# Patient Record
Sex: Female | Born: 1965 | Race: White | Hispanic: No | Marital: Married | Smoking: Never smoker
Health system: Southern US, Community
[De-identification: ages and names within clinical notes are randomized; demographics above are authoritative.]

## PROBLEM LIST (undated history)

## (undated) DIAGNOSIS — E039 Hypothyroidism, unspecified: Secondary | ICD-10-CM

## (undated) DIAGNOSIS — F419 Anxiety disorder, unspecified: Secondary | ICD-10-CM

## (undated) DIAGNOSIS — R002 Palpitations: Secondary | ICD-10-CM

## (undated) DIAGNOSIS — R5383 Other fatigue: Secondary | ICD-10-CM

## (undated) DIAGNOSIS — N951 Menopausal and female climacteric states: Secondary | ICD-10-CM

## (undated) DIAGNOSIS — R0600 Dyspnea, unspecified: Secondary | ICD-10-CM

## (undated) HISTORY — DX: Dyspnea, unspecified: R06.00

## (undated) HISTORY — DX: Palpitations: R00.2

## (undated) HISTORY — DX: Anxiety disorder, unspecified: F41.9

## (undated) HISTORY — DX: Hypothyroidism, unspecified: E03.9

## (undated) HISTORY — DX: Other fatigue: R53.83

## (undated) HISTORY — DX: Menopausal and female climacteric states: N95.1

## (undated) HISTORY — PX: HYSTEROSCOPY: SHX211

---

## 1998-06-05 ENCOUNTER — Ambulatory Visit (HOSPITAL_COMMUNITY): Admission: RE | Admit: 1998-06-05 | Discharge: 1998-06-05 | Payer: Self-pay | Admitting: Internal Medicine

## 1998-06-06 ENCOUNTER — Encounter: Payer: Self-pay | Admitting: Internal Medicine

## 2000-05-14 ENCOUNTER — Encounter: Payer: Self-pay | Admitting: Family Medicine

## 2000-05-14 ENCOUNTER — Ambulatory Visit (HOSPITAL_COMMUNITY): Admission: RE | Admit: 2000-05-14 | Discharge: 2000-05-14 | Payer: Self-pay | Admitting: Family Medicine

## 2000-05-21 ENCOUNTER — Ambulatory Visit: Admission: RE | Admit: 2000-05-21 | Discharge: 2000-05-21 | Payer: Self-pay | Admitting: Family Medicine

## 2000-11-14 ENCOUNTER — Other Ambulatory Visit: Admission: RE | Admit: 2000-11-14 | Discharge: 2000-11-14 | Payer: Self-pay | Admitting: Family Medicine

## 2001-06-30 ENCOUNTER — Encounter: Admission: RE | Admit: 2001-06-30 | Discharge: 2001-06-30 | Payer: Self-pay | Admitting: Family Medicine

## 2001-06-30 ENCOUNTER — Encounter: Payer: Self-pay | Admitting: Family Medicine

## 2002-01-01 ENCOUNTER — Encounter: Payer: Self-pay | Admitting: Family Medicine

## 2002-01-01 ENCOUNTER — Encounter: Admission: RE | Admit: 2002-01-01 | Discharge: 2002-01-01 | Payer: Self-pay | Admitting: Family Medicine

## 2002-02-18 ENCOUNTER — Other Ambulatory Visit: Admission: RE | Admit: 2002-02-18 | Discharge: 2002-02-18 | Payer: Self-pay | Admitting: Family Medicine

## 2002-11-23 ENCOUNTER — Encounter: Admission: RE | Admit: 2002-11-23 | Discharge: 2002-11-23 | Payer: Self-pay

## 2002-11-25 ENCOUNTER — Encounter: Admission: RE | Admit: 2002-11-25 | Discharge: 2002-11-25 | Payer: Self-pay

## 2003-02-22 ENCOUNTER — Encounter: Payer: Self-pay | Admitting: Family Medicine

## 2003-02-22 ENCOUNTER — Encounter: Admission: RE | Admit: 2003-02-22 | Discharge: 2003-02-22 | Payer: Self-pay | Admitting: Family Medicine

## 2003-04-08 ENCOUNTER — Other Ambulatory Visit: Admission: RE | Admit: 2003-04-08 | Discharge: 2003-04-08 | Payer: Self-pay | Admitting: Family Medicine

## 2003-04-20 ENCOUNTER — Encounter: Admission: RE | Admit: 2003-04-20 | Discharge: 2003-04-20 | Payer: Self-pay | Admitting: Family Medicine

## 2003-08-24 ENCOUNTER — Encounter: Admission: RE | Admit: 2003-08-24 | Discharge: 2003-08-24 | Payer: Self-pay | Admitting: Family Medicine

## 2004-06-06 ENCOUNTER — Other Ambulatory Visit: Admission: RE | Admit: 2004-06-06 | Discharge: 2004-06-06 | Payer: Self-pay | Admitting: Family Medicine

## 2004-06-14 ENCOUNTER — Encounter: Admission: RE | Admit: 2004-06-14 | Discharge: 2004-06-14 | Payer: Self-pay | Admitting: Family Medicine

## 2004-11-12 IMAGING — US US PELVIS COMPLETE MODIFY
1 series · 13 of 25 positions shown · non-contrast
Comparison: none

CLINICAL DATA: Left lower quadrant pain.  Follow-up ovarian cyst (based on CT 11/25/02).

[Series 1: unknown · 0.33mm/px · 13 of 42 slices shown]
[im 1/42]
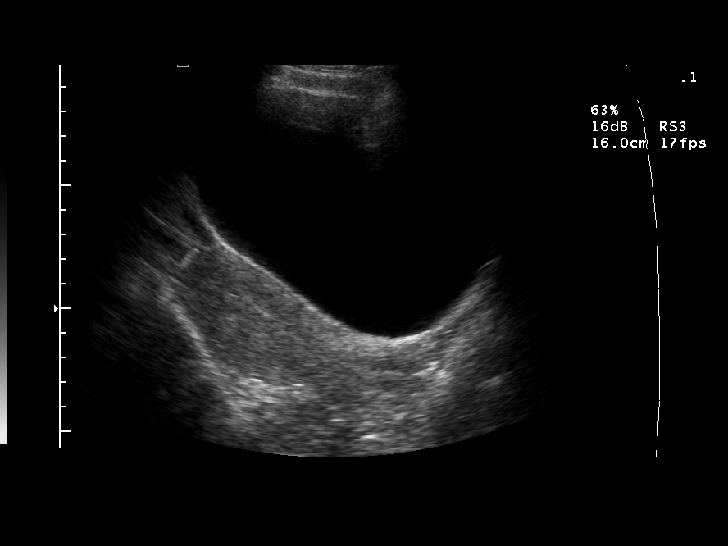
[im 4/42]
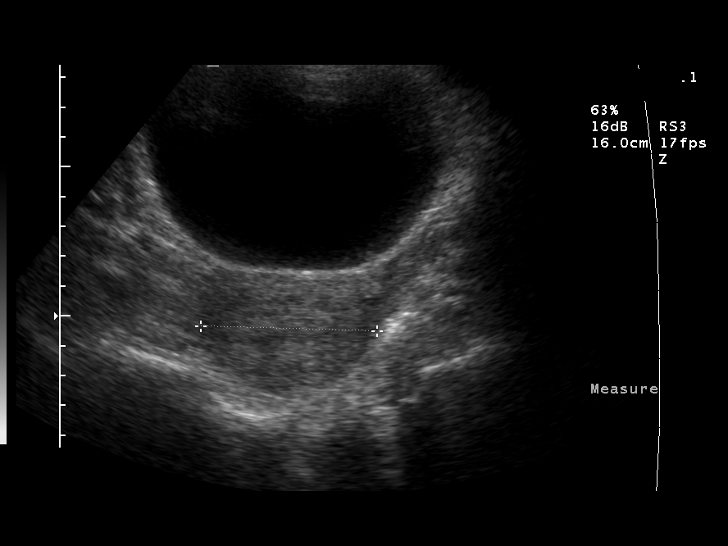
[im 7/42]
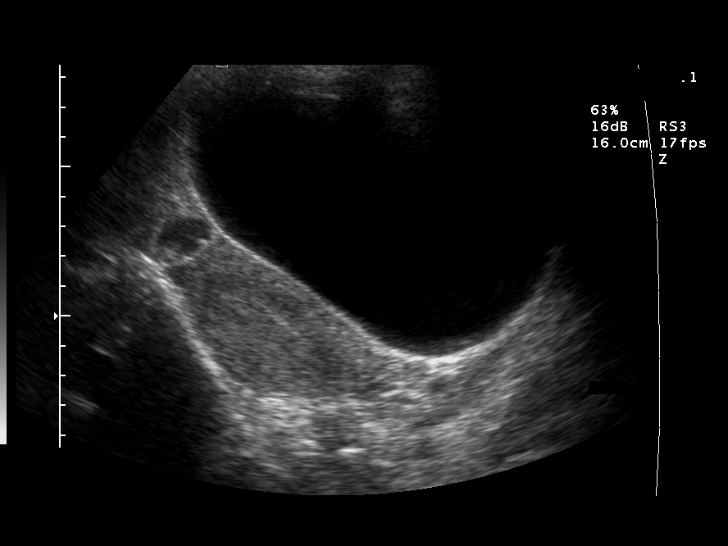
[im 11/42]
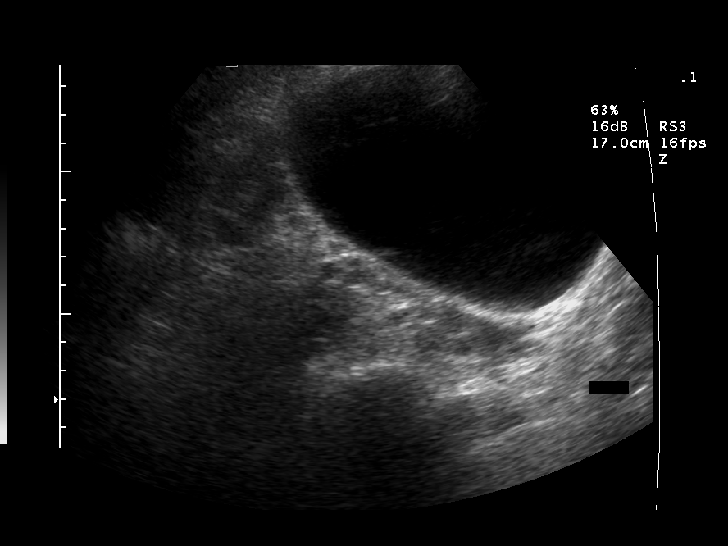
[im 14/42]
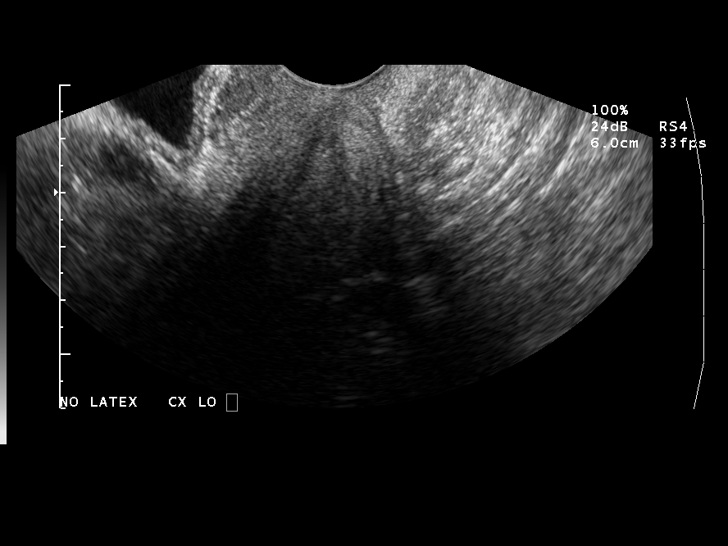
[im 18/42]
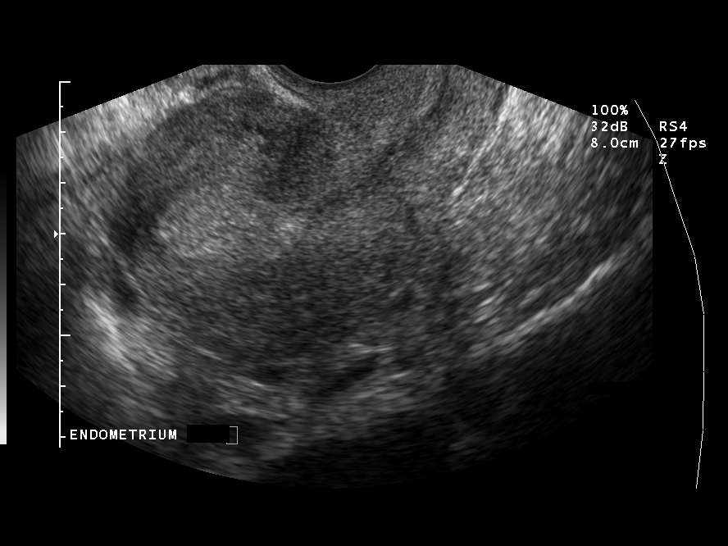
[im 21/42]
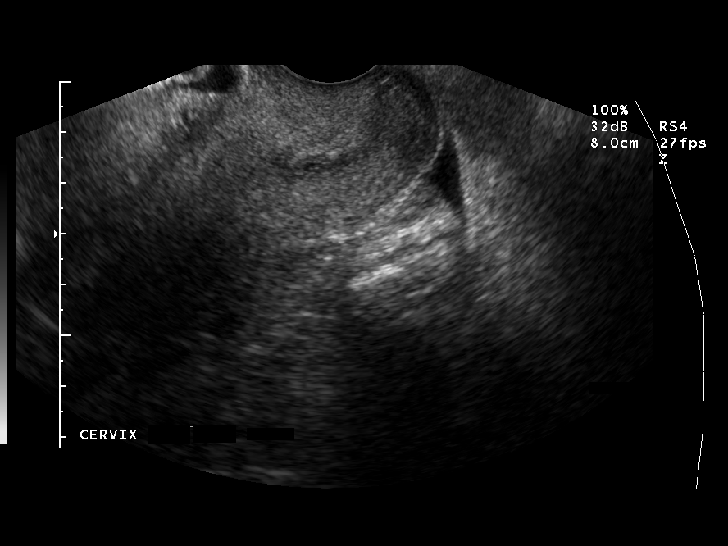
[im 24/42]
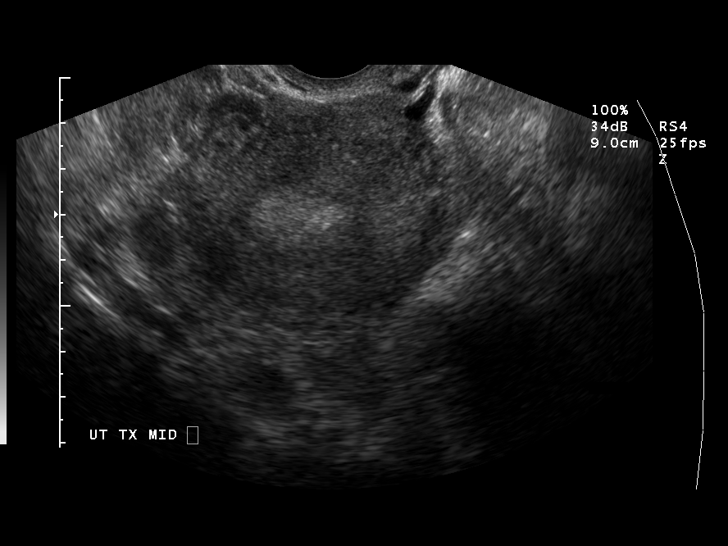
[im 28/42]
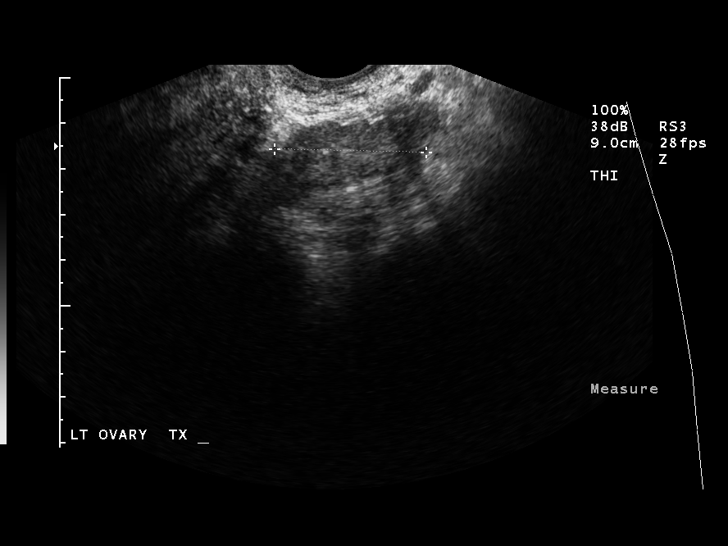
[im 31/42]
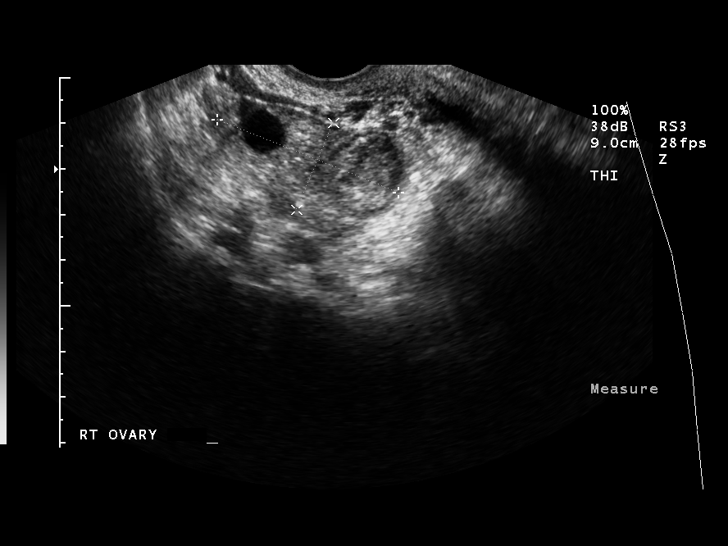
[im 35/42]
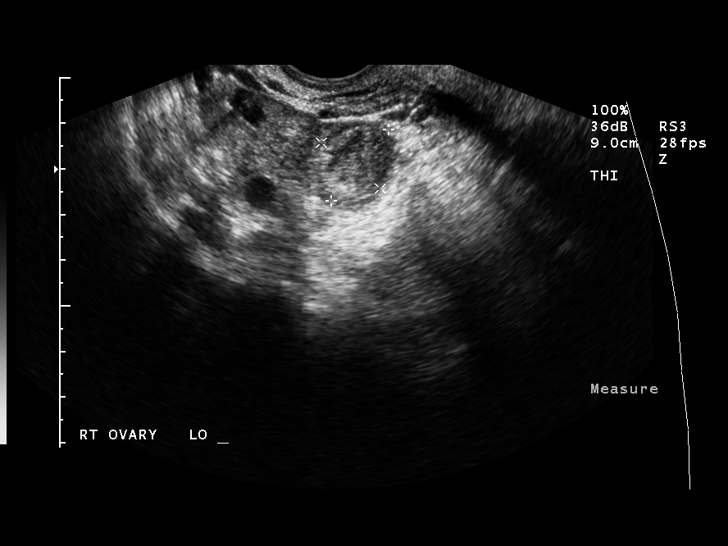
[im 38/42]
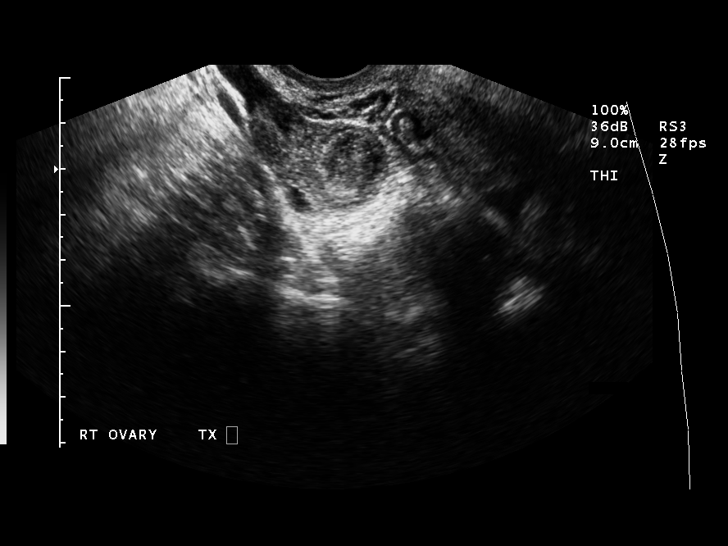
[im 42/42]
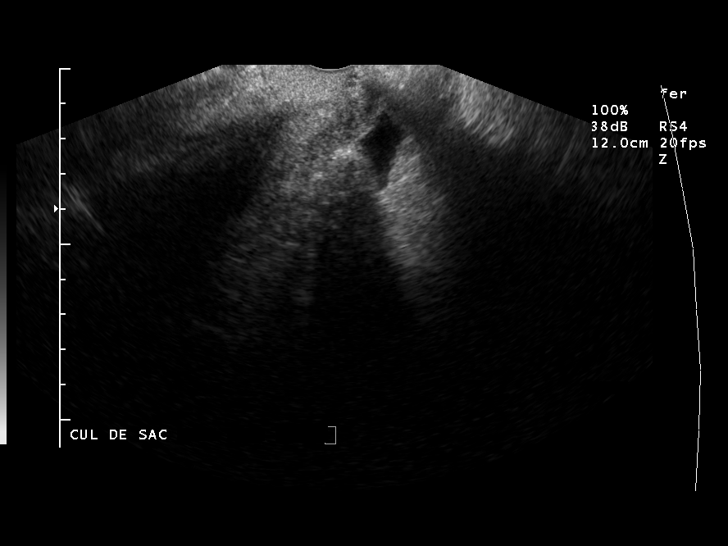

[13 of 25 positions shown; findings below may reference images not displayed]

PELVIS ULTRASOUND ? TRANSABDOMINAL AND TRANSVAGINAL NON-OB
 Overall uterine size and contour are within normal limits.  The endometrium is homogeneously thickened but the patient is midcycle so this is probably physiological.  There are no lesions of the myometrium.

 The left ovary is currently normal, measuring 3.1 x 1.6 x 3.3 cm.  A left ovarian cyst noted on the prior CT has resolved.

 The right ovary is 4.3 x 2.1 x 3.7 cm.  Within it, there is a complex lesion measuring 2.0 x 1.6 x 1.5 cm.  This has the appearance of a hemorrhagic corpus luteum cyst.  The right ovary was normal on the prior CT so this is a new finding since then.  Careful clinical correlation is warranted.  One might consider a follow-up ultrasound in four to six weeks.

 There is a small amount of fluid in the cul-de-sac that I would not regard as pathological.

 IMPRESSION

 1.  Left ovarian cyst noted on CT in November 2002 has resolved.  The left ovary is currently normal.
 2.  There is now, however, a 2 cm complex cystic lesion of the right ovary.  This is most likely a hemorrhagic corpus luteum cyst.  See above.
 3.  No other findings of significance.

## 2008-03-09 ENCOUNTER — Encounter: Admission: RE | Admit: 2008-03-09 | Discharge: 2008-03-09 | Payer: Self-pay | Admitting: Family Medicine

## 2008-09-28 ENCOUNTER — Encounter: Admission: RE | Admit: 2008-09-28 | Discharge: 2008-09-28 | Payer: Self-pay | Admitting: Endocrinology

## 2010-01-12 ENCOUNTER — Encounter: Admission: RE | Admit: 2010-01-12 | Discharge: 2010-01-12 | Payer: Self-pay | Admitting: Endocrinology

## 2010-04-23 IMAGING — US US SOFT TISSUE HEAD/NECK
1 series · 14 of 25 positions shown · non-contrast
Comparison: 03/09/2008

CLINICAL DATA: Follow up goiter

THYROID ULTRASOUND
TECHNIQUE: Ultrasound examination of the thyroid gland and
adjacent soft tissues was performed.

[Series 1: us soft tissue head/neck · 0.08mm/px · 14 of 41 slices shown]
[im 1/41]
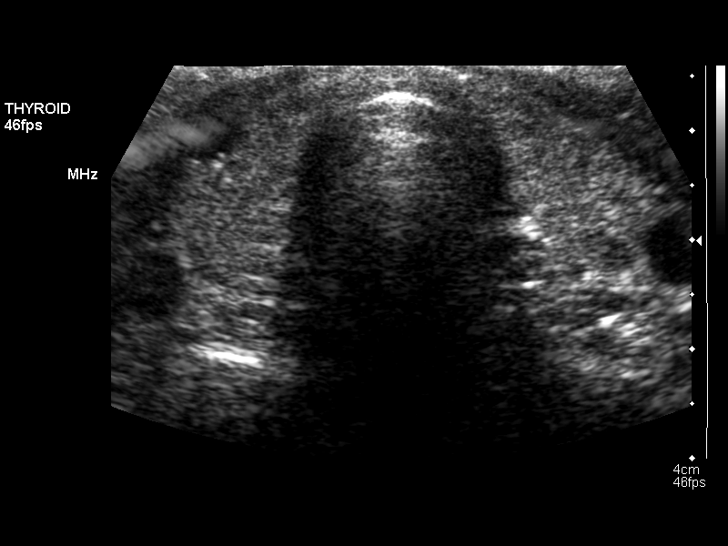
[im 4/41]
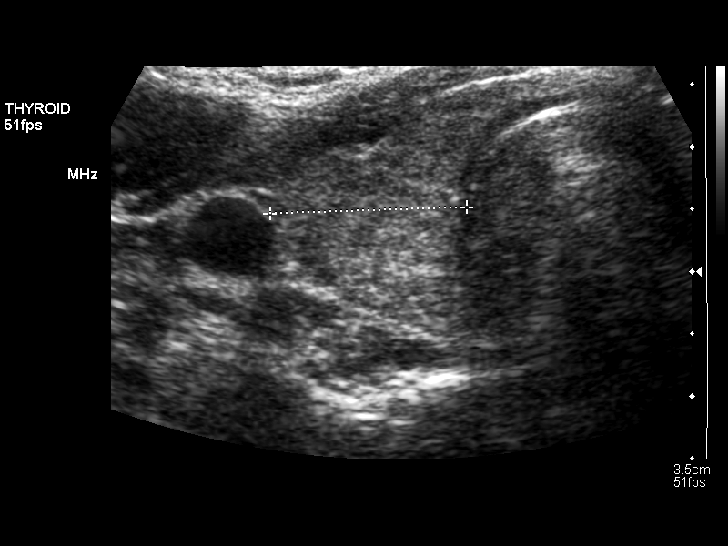
[im 7/41]
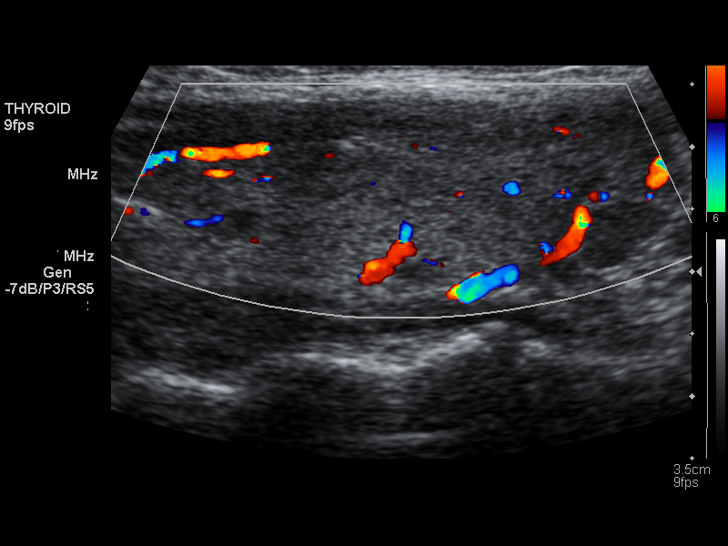
[im 11/41]
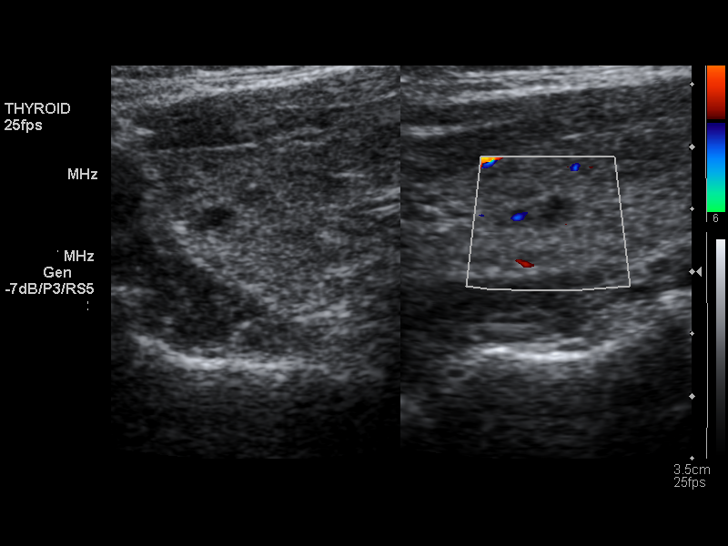
[im 14/41]
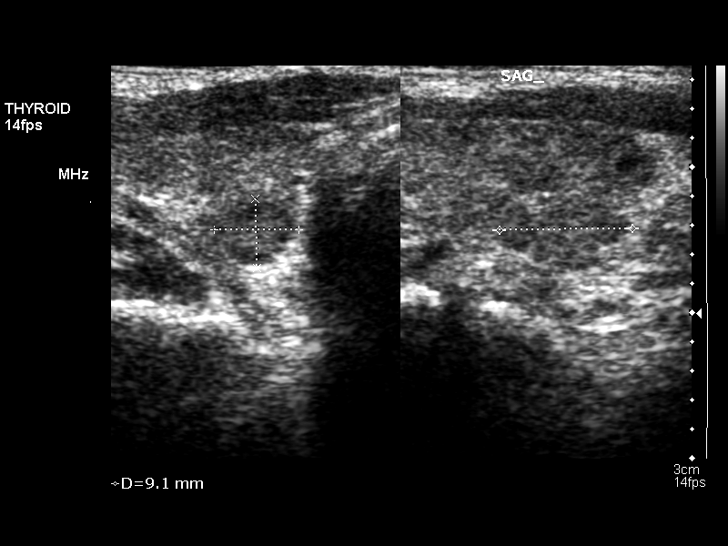
[im 16/41]
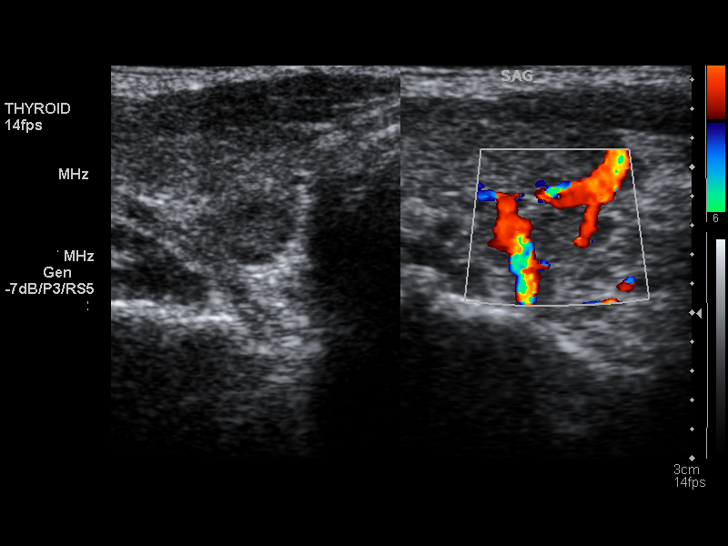
[im 19/41]
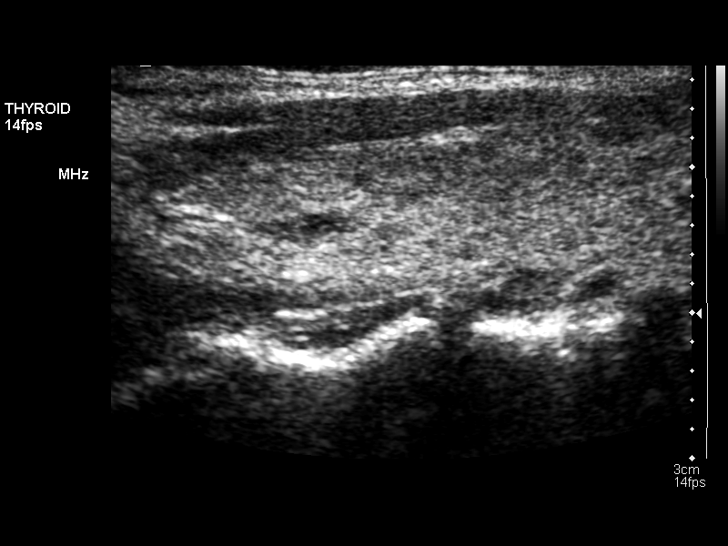
[im 22/41]
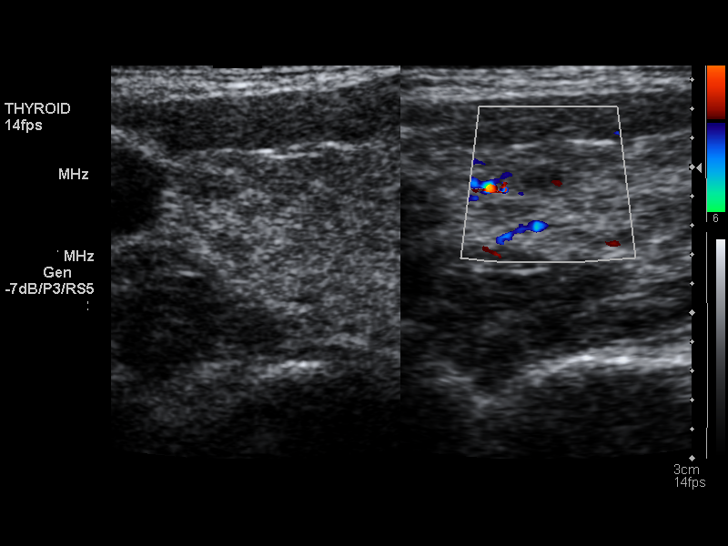
[im 26/41]
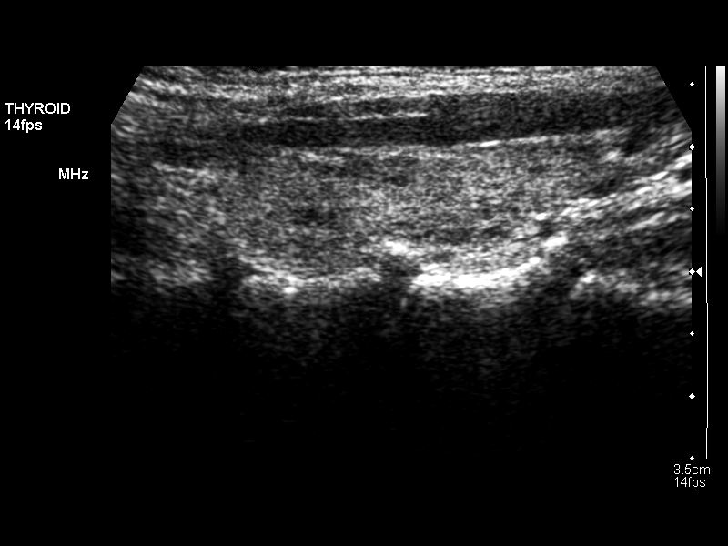
[im 27/41]
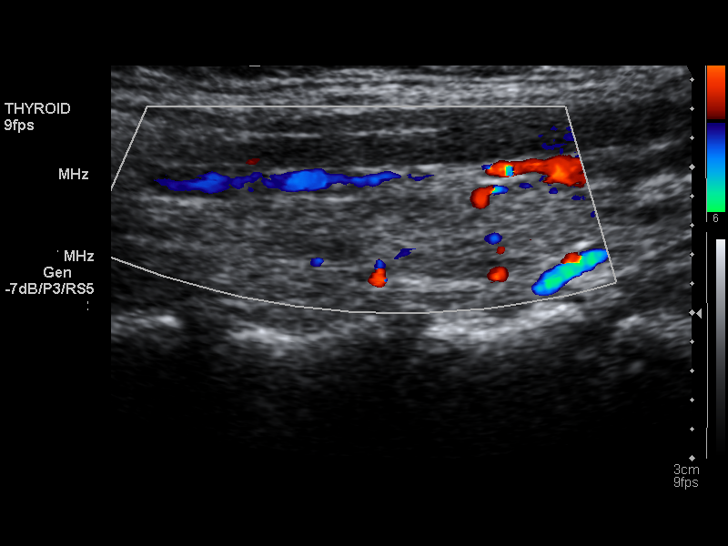
[im 31/41]
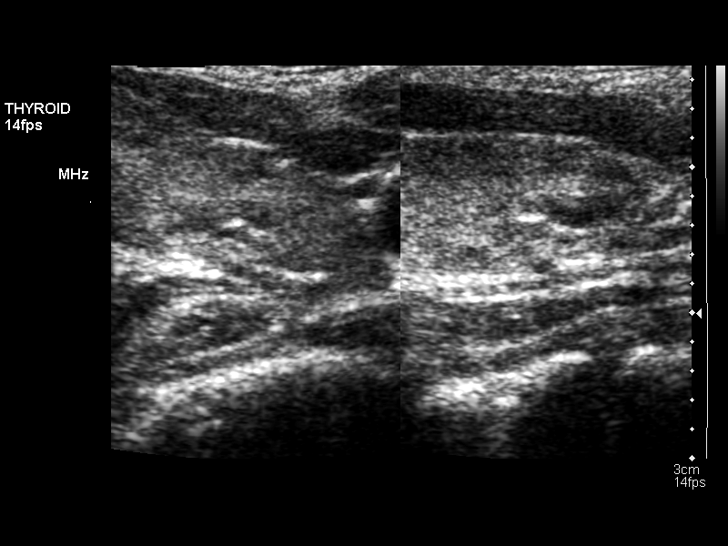
[im 34/41]
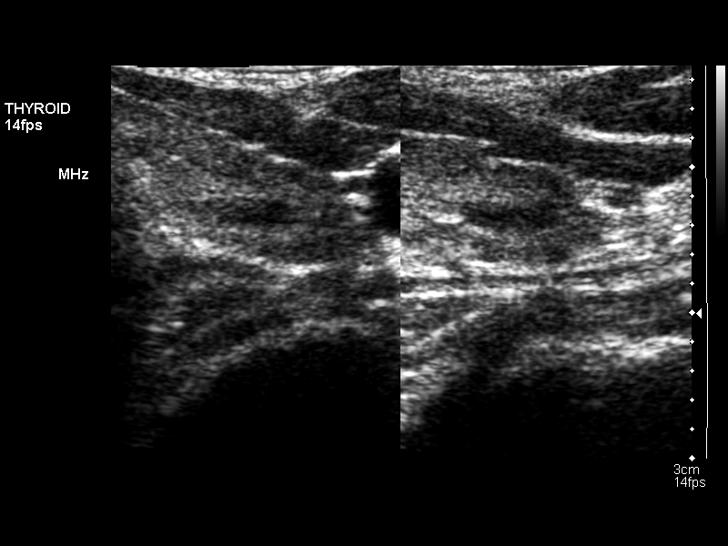
[im 37/41]
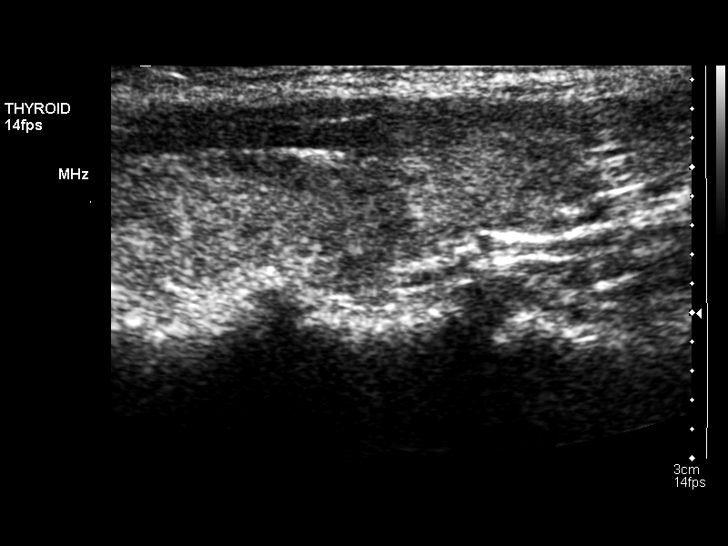
[im 41/41]
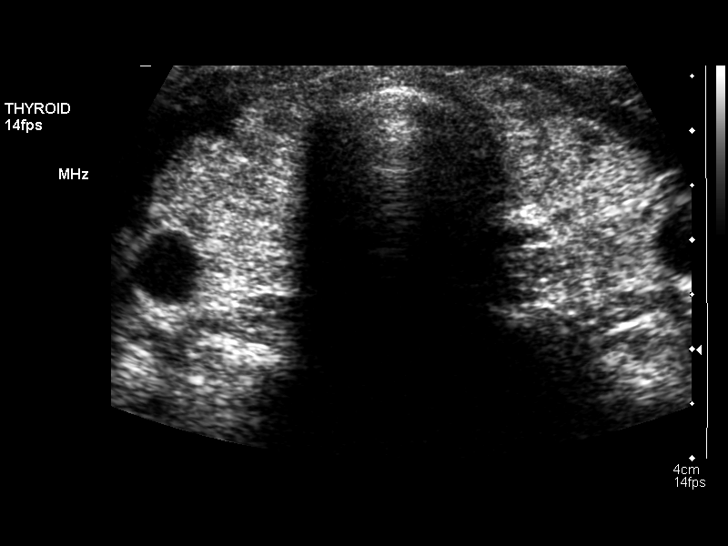

[14 of 25 positions shown; findings below may reference images not displayed]

FINDINGS: Stable appearance of 3 nodules in the periphery of the
right thyroid lobe and one nodule in the mid aspect left thyroid
lobe and one in the left lower pole.  All of the nodules measure
less than a centimeter in diameter. There is diffuse inhomogeneity
of thyroid echotexture.
IMPRESSION: Stable appearance of the thyroid gland with multinodular goiter
appreciated.  The nodules are stable in size and character.  All of
the nodules measure less than a centimeter in diameter.

## 2010-07-13 ENCOUNTER — Other Ambulatory Visit (HOSPITAL_COMMUNITY)
Admission: RE | Admit: 2010-07-13 | Discharge: 2010-07-13 | Disposition: A | Discharge status: Home / Self Care | Payer: BC Managed Care – PPO | Source: Ambulatory Visit | Attending: Endocrinology | Admitting: Endocrinology

## 2010-07-13 DIAGNOSIS — Z01419 Encounter for gynecological examination (general) (routine) without abnormal findings: Secondary | ICD-10-CM | POA: Insufficient documentation

## 2010-10-23 ENCOUNTER — Other Ambulatory Visit: Payer: Self-pay | Admitting: Endocrinology

## 2010-10-23 DIAGNOSIS — E049 Nontoxic goiter, unspecified: Secondary | ICD-10-CM

## 2011-01-14 ENCOUNTER — Ambulatory Visit
Admission: RE | Admit: 2011-01-14 | Discharge: 2011-01-14 | Disposition: A | Discharge status: Home / Self Care | Payer: BC Managed Care – PPO | Source: Ambulatory Visit | Attending: Endocrinology | Admitting: Endocrinology

## 2011-01-14 DIAGNOSIS — E049 Nontoxic goiter, unspecified: Secondary | ICD-10-CM

## 2012-07-23 ENCOUNTER — Other Ambulatory Visit: Payer: Self-pay | Admitting: Endocrinology

## 2012-07-23 DIAGNOSIS — E049 Nontoxic goiter, unspecified: Secondary | ICD-10-CM

## 2012-08-08 IMAGING — US US SOFT TISSUE HEAD/NECK
1 series · 14 of 25 positions shown · non-contrast
Comparison: Thyroid ultrasound 01/12/2010.

CLINICAL DATA: Thyroid goiter.

THYROID ULTRASOUND
TECHNIQUE: Ultrasound examination of the thyroid gland and adjacent
soft tissues was performed.

[Series 1: us soft tissue head/neck · 0.08mm/px · 14 of 48 slices shown]
[im 1/48]
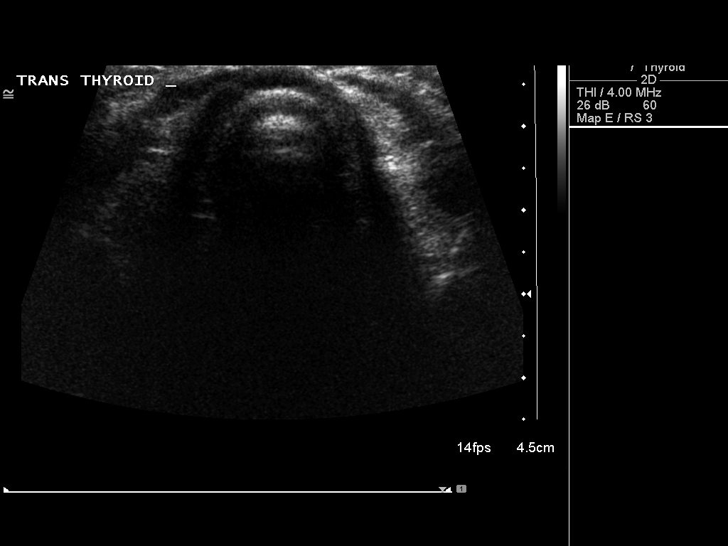
[im 4/48]
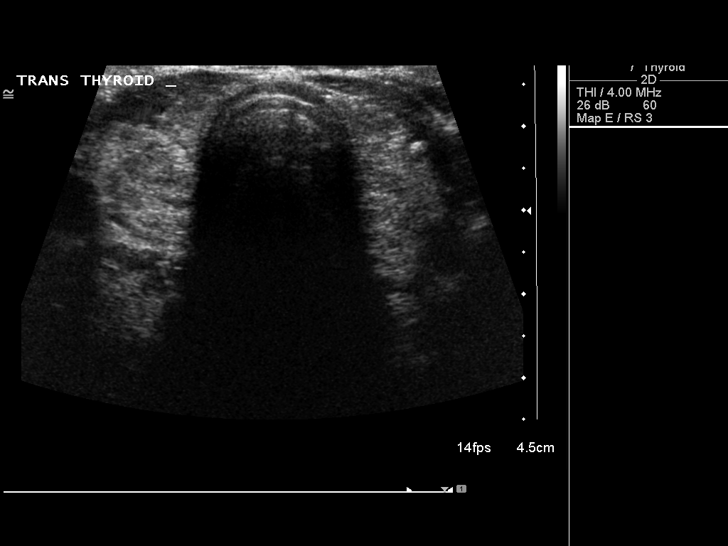
[im 8/48]
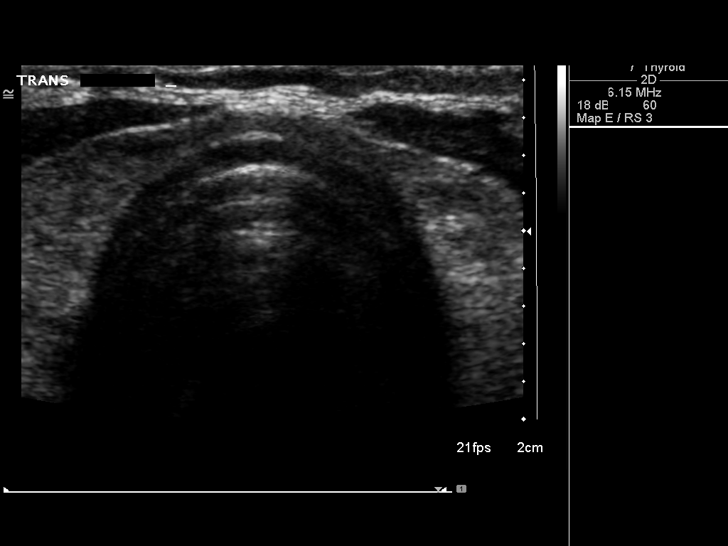
[im 12/48]
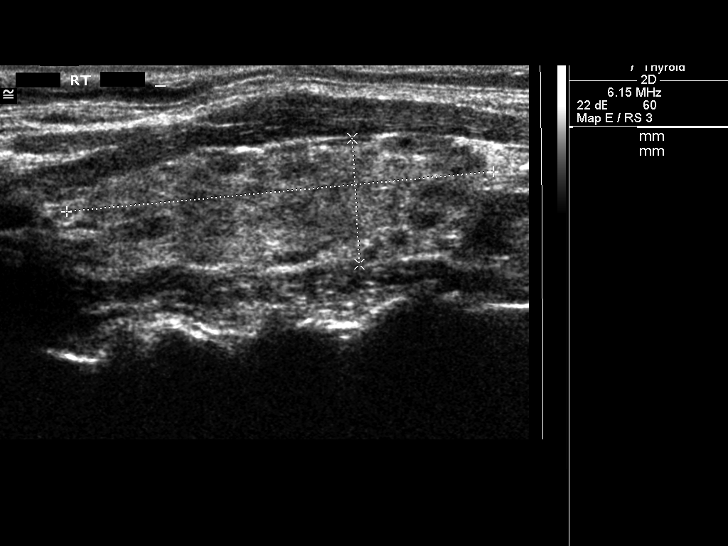
[im 16/48]
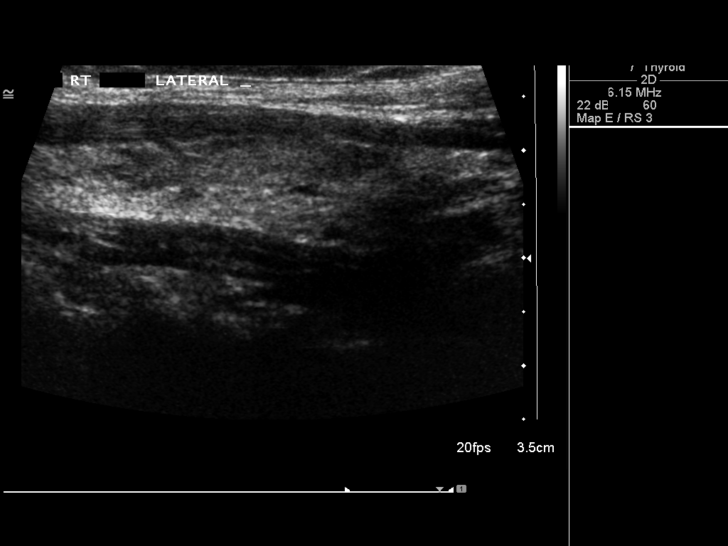
[im 18/48]
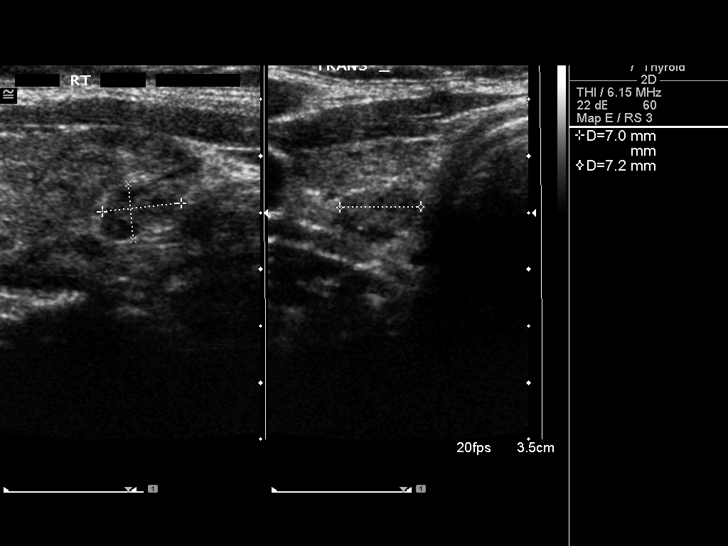
[im 22/48]
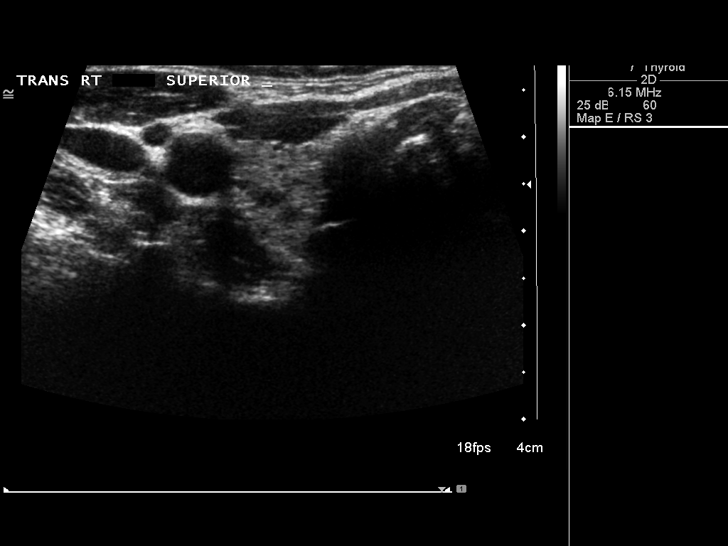
[im 26/48]
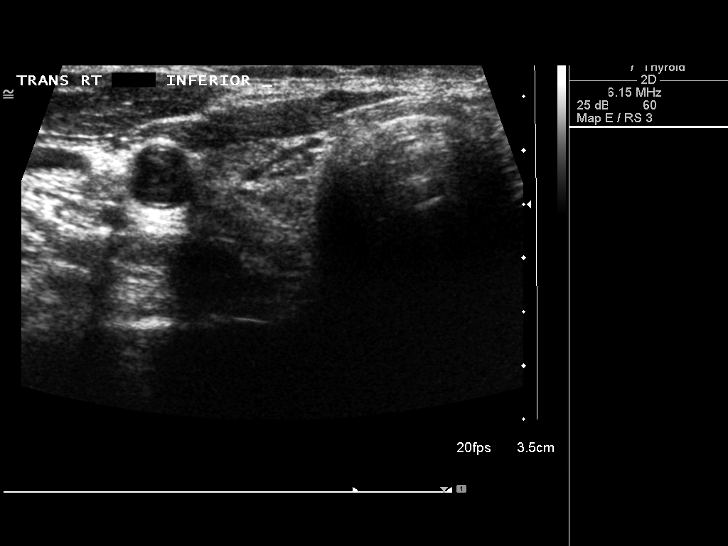
[im 30/48]
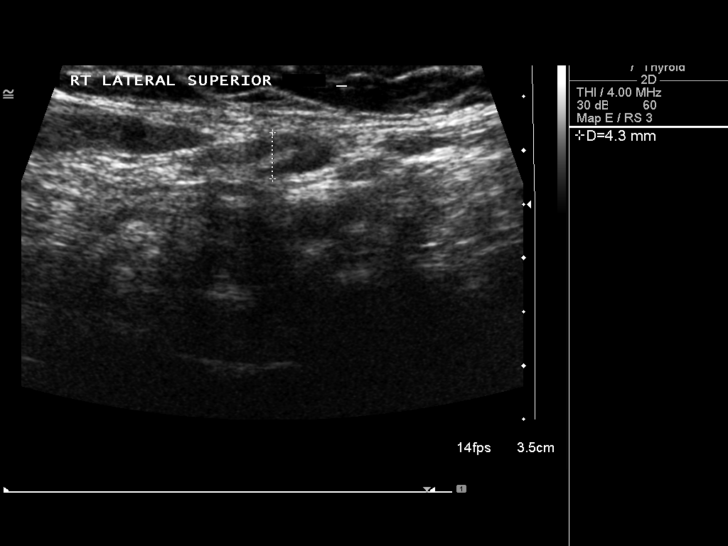
[im 32/48]
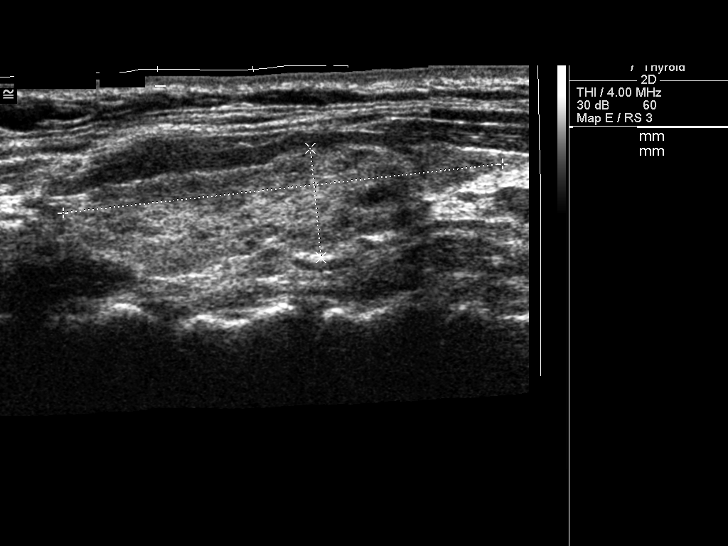
[im 36/48]
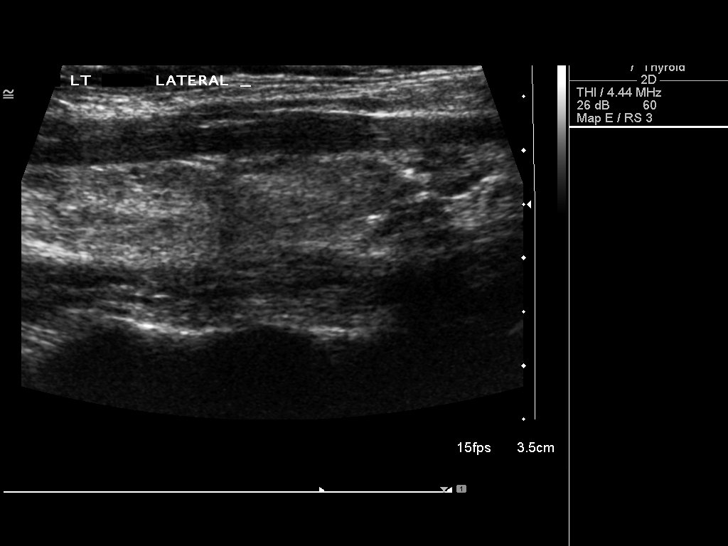
[im 40/48]
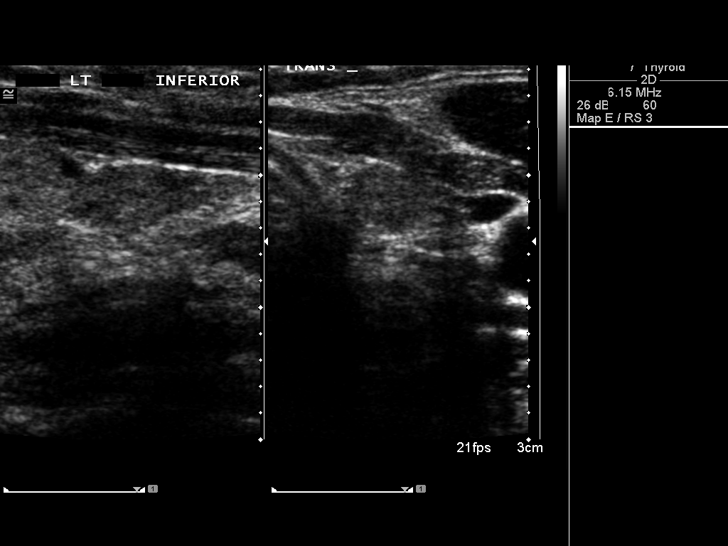
[im 44/48]
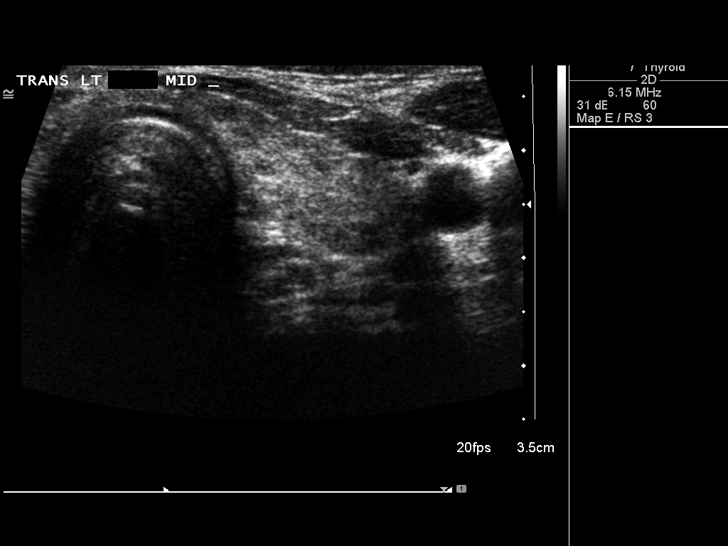
[im 48/48]
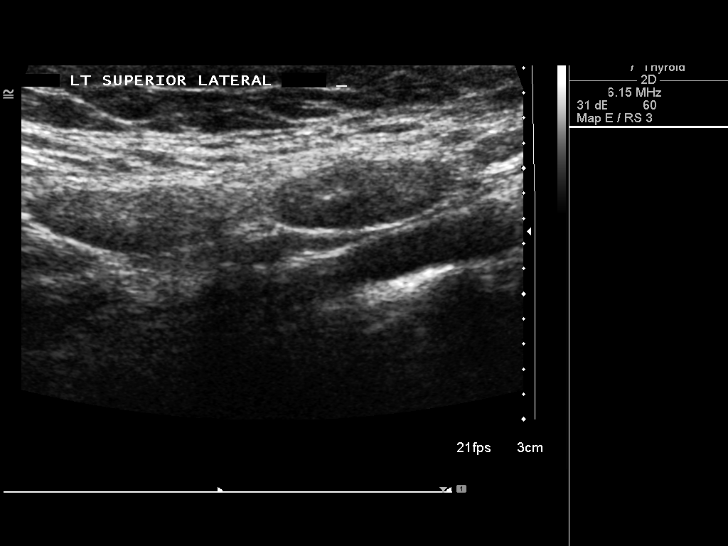

[14 of 25 positions shown; findings below may reference images not displayed]

FINDINGS: Right thyroid lobe:  Measures 4.3 x 1.8 x 1.6 cm.
Left thyroid lobe:  Measures 4.6 x 1.2 x 1.7 cm.
Isthmus:  Measures 0.1 cm.

Stable heterogeneous echogenicity.

Focal nodules:  There are stable small sub centimeter lower pole
nodules.

Lymphadenopathy:  Small scattered stable lymph nodes.
IMPRESSION: Stable thyroid ultrasound examination.  No change in small sub
centimeter lower pole nodules.

## 2012-12-28 ENCOUNTER — Other Ambulatory Visit: Payer: BC Managed Care – PPO

## 2012-12-31 ENCOUNTER — Ambulatory Visit
Admission: RE | Admit: 2012-12-31 | Discharge: 2012-12-31 | Disposition: A | Discharge status: Home / Self Care | Payer: BC Managed Care – PPO | Source: Ambulatory Visit | Attending: Endocrinology | Admitting: Endocrinology

## 2012-12-31 DIAGNOSIS — E049 Nontoxic goiter, unspecified: Secondary | ICD-10-CM

## 2014-01-25 ENCOUNTER — Other Ambulatory Visit: Payer: Self-pay | Admitting: Endocrinology

## 2014-01-25 DIAGNOSIS — E049 Nontoxic goiter, unspecified: Secondary | ICD-10-CM

## 2014-01-28 ENCOUNTER — Ambulatory Visit
Admission: RE | Admit: 2014-01-28 | Discharge: 2014-01-28 | Disposition: A | Discharge status: Home / Self Care | Payer: BC Managed Care – PPO | Source: Ambulatory Visit | Attending: Endocrinology | Admitting: Endocrinology

## 2014-01-28 DIAGNOSIS — E049 Nontoxic goiter, unspecified: Secondary | ICD-10-CM

## 2015-03-07 ENCOUNTER — Other Ambulatory Visit: Payer: Self-pay | Admitting: Endocrinology

## 2015-03-07 DIAGNOSIS — E049 Nontoxic goiter, unspecified: Secondary | ICD-10-CM

## 2015-05-25 ENCOUNTER — Ambulatory Visit (HOSPITAL_BASED_OUTPATIENT_CLINIC_OR_DEPARTMENT_OTHER): Admit: 2015-05-25 | Payer: Self-pay | Admitting: Obstetrics and Gynecology

## 2015-05-25 ENCOUNTER — Encounter (HOSPITAL_BASED_OUTPATIENT_CLINIC_OR_DEPARTMENT_OTHER): Payer: Self-pay

## 2015-05-25 SURGERY — DILATATION & CURETTAGE/HYSTEROSCOPY WITH MYOSURE
Anesthesia: Choice

## 2016-02-02 ENCOUNTER — Ambulatory Visit
Admission: RE | Admit: 2016-02-02 | Discharge: 2016-02-02 | Disposition: A | Discharge status: Home / Self Care | Payer: BLUE CROSS/BLUE SHIELD | Source: Ambulatory Visit | Attending: Endocrinology | Admitting: Endocrinology

## 2016-02-02 DIAGNOSIS — E049 Nontoxic goiter, unspecified: Secondary | ICD-10-CM

## 2016-03-01 ENCOUNTER — Other Ambulatory Visit: Payer: Self-pay

## 2016-03-04 ENCOUNTER — Other Ambulatory Visit: Payer: Self-pay

## 2018-02-12 ENCOUNTER — Other Ambulatory Visit: Payer: Self-pay | Admitting: Endocrinology

## 2018-02-12 DIAGNOSIS — E049 Nontoxic goiter, unspecified: Secondary | ICD-10-CM

## 2018-02-19 ENCOUNTER — Other Ambulatory Visit: Payer: BLUE CROSS/BLUE SHIELD

## 2018-03-05 ENCOUNTER — Ambulatory Visit
Admission: RE | Admit: 2018-03-05 | Discharge: 2018-03-05 | Disposition: A | Discharge status: Home / Self Care | Payer: PRIVATE HEALTH INSURANCE | Source: Ambulatory Visit | Attending: Endocrinology | Admitting: Endocrinology

## 2018-03-05 DIAGNOSIS — E049 Nontoxic goiter, unspecified: Secondary | ICD-10-CM

## 2018-04-02 ENCOUNTER — Ambulatory Visit: Payer: Self-pay | Admitting: Cardiovascular Disease

## 2018-07-24 ENCOUNTER — Ambulatory Visit: Payer: Self-pay | Admitting: Cardiovascular Disease

## 2019-05-27 ENCOUNTER — Ambulatory Visit: Payer: PRIVATE HEALTH INSURANCE | Attending: Internal Medicine

## 2019-05-27 DIAGNOSIS — Z20822 Contact with and (suspected) exposure to covid-19: Secondary | ICD-10-CM

## 2019-05-29 LAB — NOVEL CORONAVIRUS, NAA: SARS-CoV-2, NAA: NOT DETECTED

## 2019-07-14 IMAGING — US US THYROID
1 series · 14 of 25 positions shown · non-contrast
Comparison: 02/02/2016, 12/31/2012

CLINICAL DATA: Goiter.

EXAM:
THYROID ULTRASOUND
TECHNIQUE: Ultrasound examination of the thyroid gland and adjacent soft
tissues was performed.

[Series 1: us thyroid · 0.05mm/px · 14 of 57 slices shown]
[im 1/57]
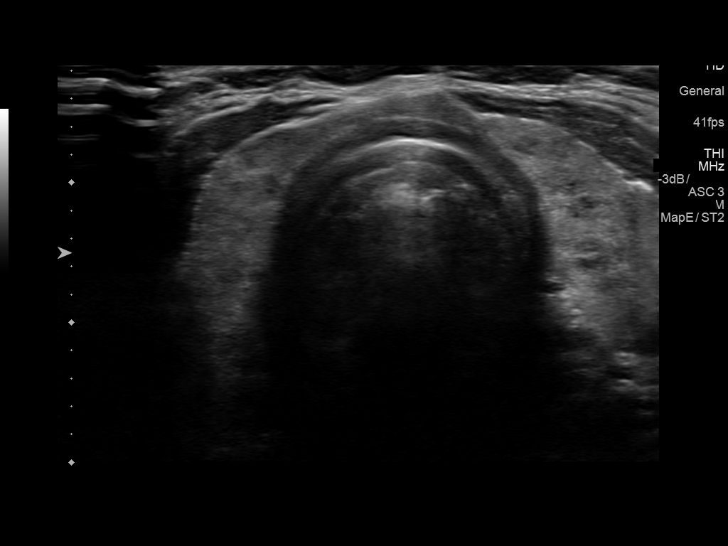
[im 5/57]
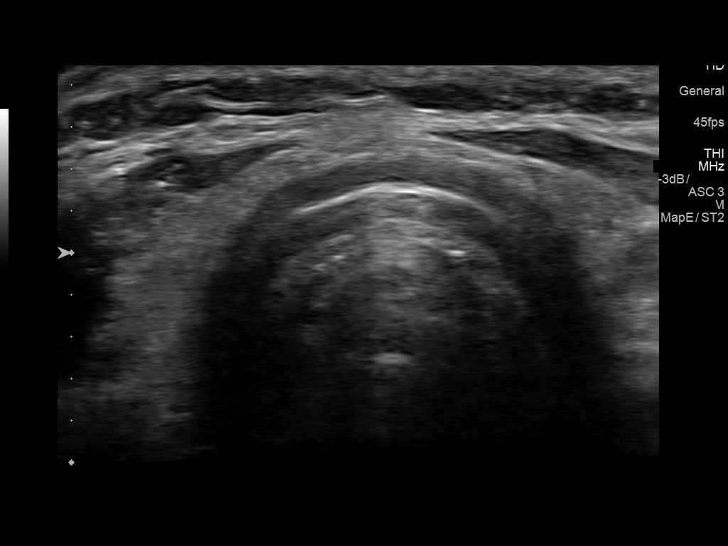
[im 10/57]
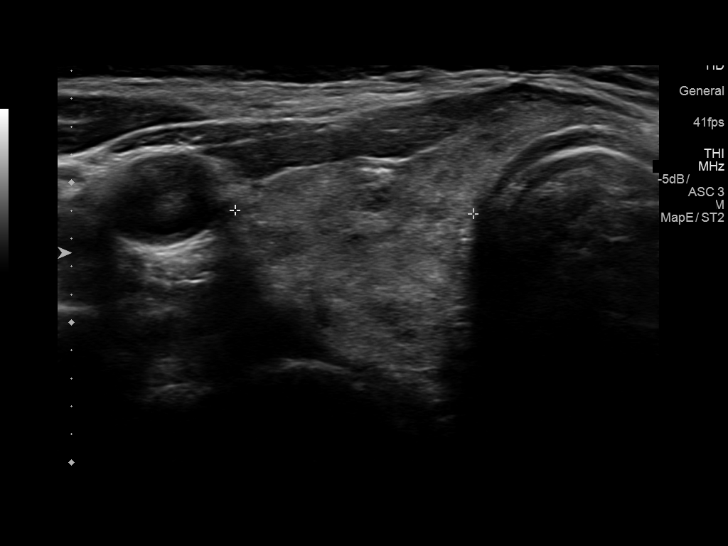
[im 15/57]
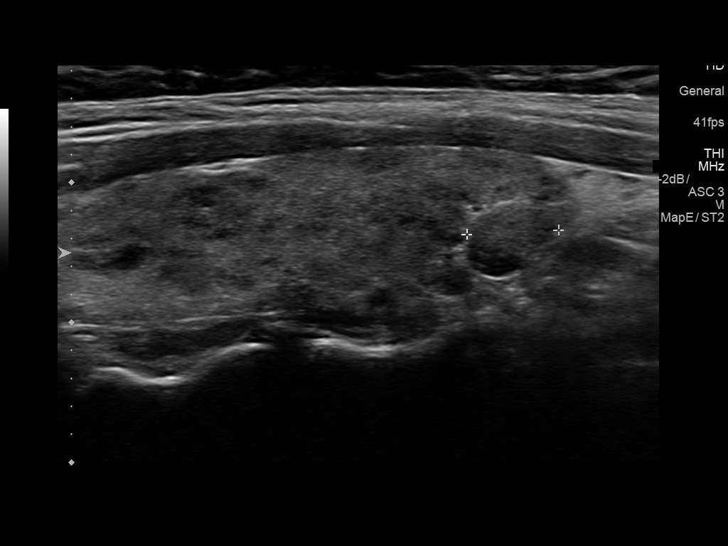
[im 19/57]
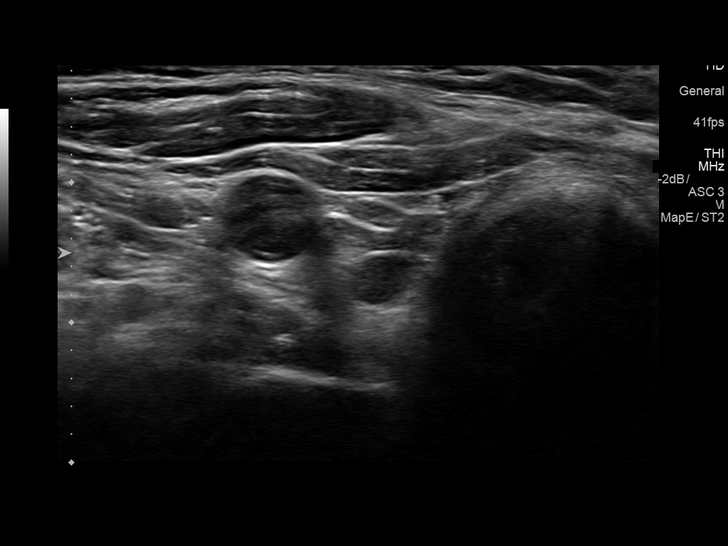
[im 22/57]
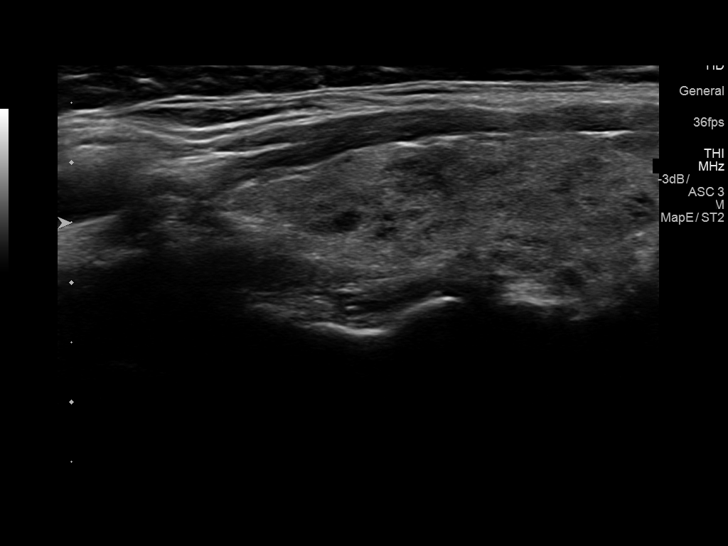
[im 26/57]
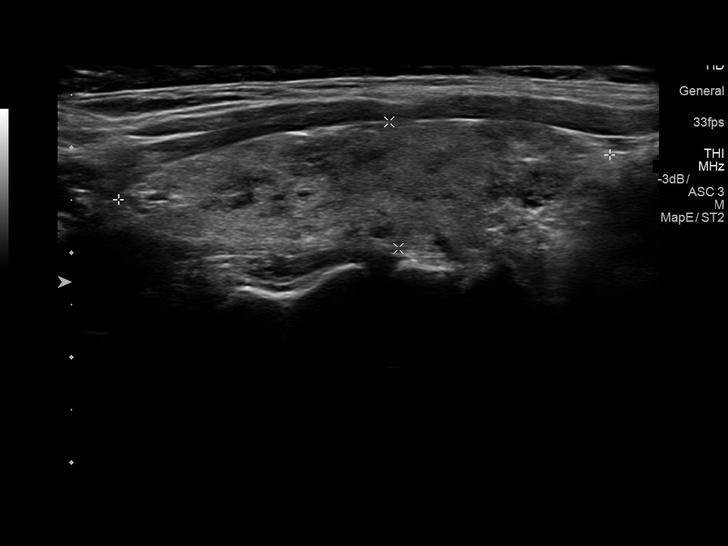
[im 31/57]
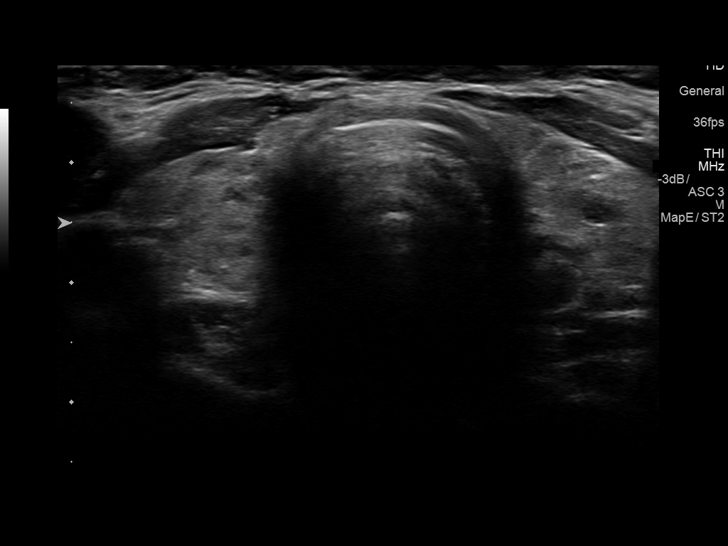
[im 36/57]
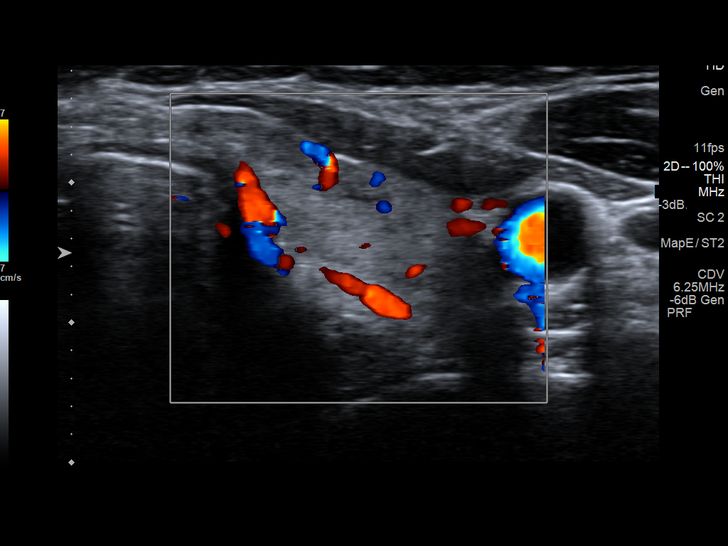
[im 38/57]
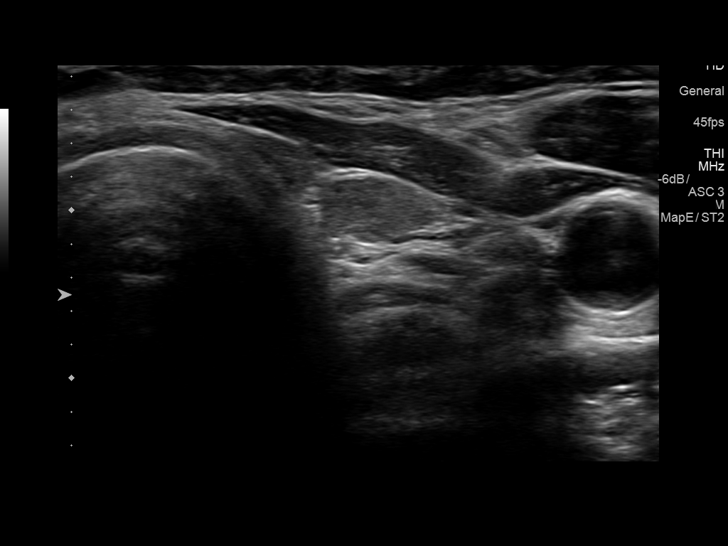
[im 43/57]
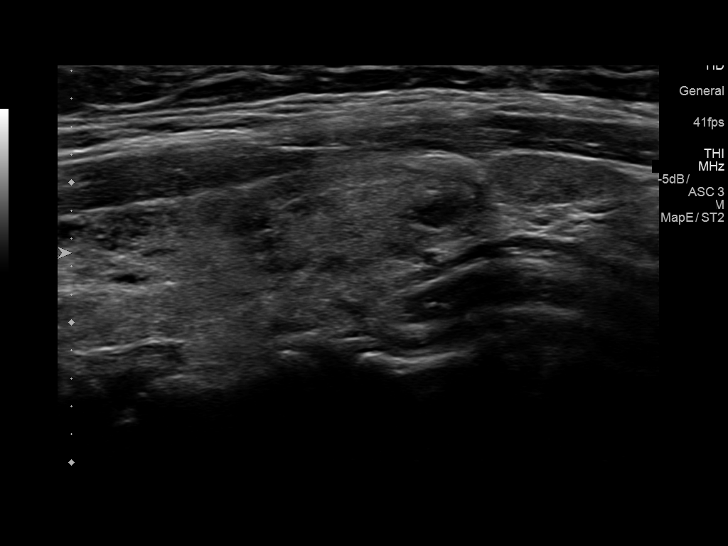
[im 47/57]
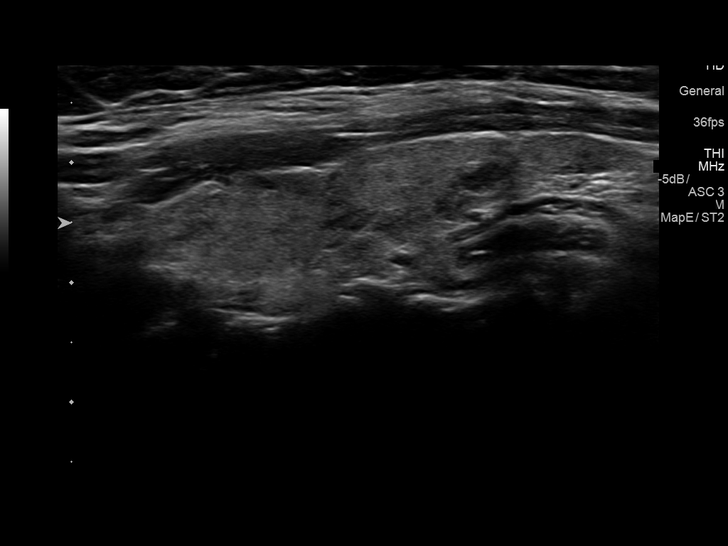
[im 52/57]
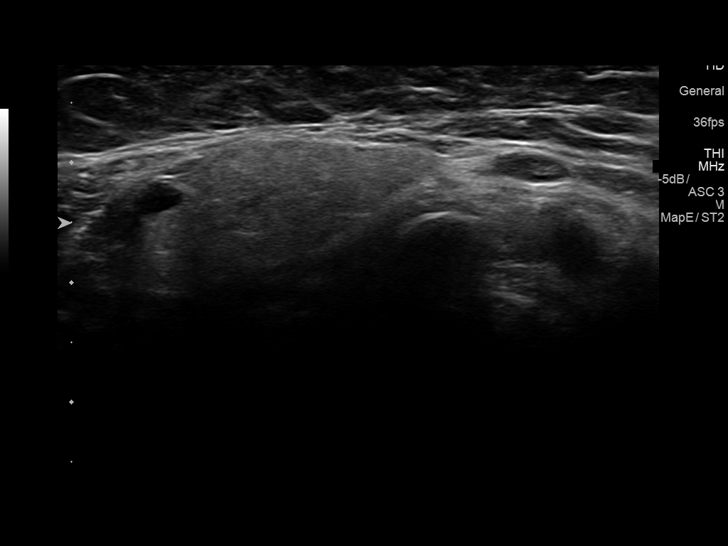
[im 57/57]
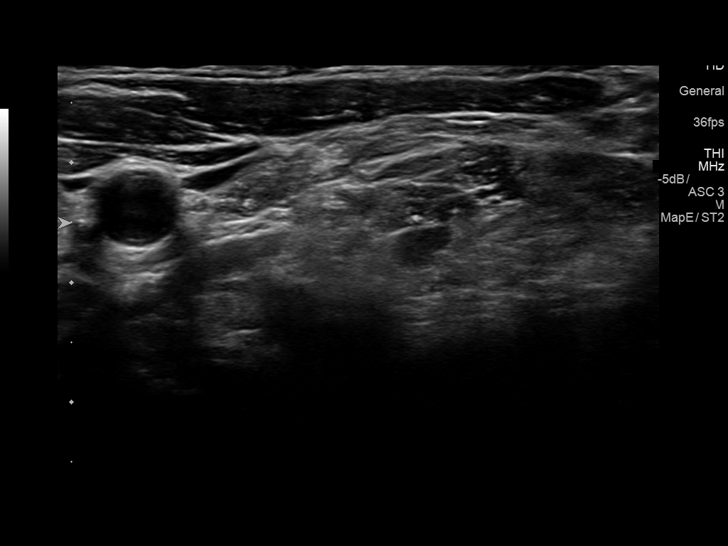

[14 of 25 positions shown; findings below may reference images not displayed]

FINDINGS: Parenchymal Echotexture: Moderately heterogenous

Isthmus: 0.2 cm thickness, stable

Right lobe: 4.7 x 1.2 x 1.7 cm, previously 4.7 x 1.3 x

Left lobe: 4.3 x 1 x 1.5 cm, previously 4.6 x 0.9 x

_________________________________________________________

Estimated total number of nodules >/= 1 cm: 0

Number of spongiform nodules >/=  2 cm not described below (TR1): 0

Number of mixed cystic and solid nodules >/= 1.5 cm not described
below (TR2): 0

_________________________________________________________

0.7 cm hypoechoic region in the inferior right lobe, possibly nodule
versus pseudo nodule, stable since 12/31/2012; stability for greater
than 5 years implies benignity; This nodule does NOT meet TI-RADS
criteria for biopsy or dedicated follow-up.
IMPRESSION: Normal-sized heterogenous thyroid. No imaging indication for biopsy
or continued follow-up.

The above is in keeping with the ACR TI-RADS recommendations - [HOSPITAL] 3179;[DATE].

## 2019-11-29 ENCOUNTER — Telehealth: Payer: Self-pay

## 2019-11-29 NOTE — Telephone Encounter (Signed)
NOTES ON FILE FROM [facility] [phone] SENT REFERRAL TO SCHEDULING 

## 2019-12-10 ENCOUNTER — Other Ambulatory Visit: Payer: Self-pay

## 2019-12-10 ENCOUNTER — Ambulatory Visit (INDEPENDENT_AMBULATORY_CARE_PROVIDER_SITE_OTHER): Payer: PRIVATE HEALTH INSURANCE | Admitting: Interventional Cardiology

## 2019-12-10 ENCOUNTER — Encounter: Payer: Self-pay | Admitting: Interventional Cardiology

## 2019-12-10 VITALS — BP 112/80 | HR 86 | Ht 66.0 in | Wt 177.6 lb

## 2019-12-10 DIAGNOSIS — R002 Palpitations: Secondary | ICD-10-CM

## 2019-12-10 DIAGNOSIS — R0600 Dyspnea, unspecified: Secondary | ICD-10-CM | POA: Diagnosis not present

## 2019-12-10 DIAGNOSIS — E039 Hypothyroidism, unspecified: Secondary | ICD-10-CM

## 2019-12-10 DIAGNOSIS — R0609 Other forms of dyspnea: Secondary | ICD-10-CM

## 2019-12-10 NOTE — Patient Instructions (Signed)
Medication Instructions:  Your physician recommends that you continue on your current medications as directed. Please refer to the Current Medication list given to you today.  *If you need a refill on your cardiac medications before your next appointment, please call your pharmacy*   Lab Work: None  If you have labs (blood work) drawn today and your tests are completely normal, you will receive your results only by: Marland Kitchen MyChart Message (if you have MyChart) OR . A paper copy in the mail If you have any lab test that is abnormal or we need to change your treatment, we will call you to review the results.   Testing/Procedures: Your physician has requested that you have an echocardiogram. Echocardiography is a painless test that uses sound waves to create images of your heart. It provides your doctor with information about the size and shape of your heart and how well your heart's chambers and valves are working. This procedure takes approximately one hour. There are no restrictions for this procedure.  Your physician has requested that you have an exercise tolerance test. For further information please visit [email or web address] Please also follow instruction sheet, as given.   Follow-Up: Based on test results   Other Instructions None

## 2019-12-10 NOTE — Progress Notes (Signed)
Cardiology Office Note   Date:  12/10/2019   ID:  Ann Gross, DOB 05/22/1965, MRN [id number]  PCP:  Merri Brunette, MD    No chief complaint on file.  palpitations  Wt Readings from Last 3 Encounters:  12/10/19 177 lb 9.6 oz (80.6 kg)       History of Present Illness: Ann Gross is a 54 y.o. female who is being seen today for the evaluation of palpitations at the request of Merri Brunette, MD.  Palpitations started in 2019.  She postponed eval due to family issues and COVID.  Sx persisted, but she did not seek treatment until now.  A few weeks ago, she felt a "thud" in her chest that last a second.  She reports feeling tired.  She has had some DOE with mild exertion, like unloading the car.  Sometimes she has to stop walking.  She feels that she has decreased stamina, but has not been exercising regularly.  She is active with her young grandchildren.  She can have some SHOB at rest, she thinks perhaps form anxiety.   No prior cardiac w/u.  Husband sees Dr. Royann Shivers.    Family hx negative for early CAD.     Past Medical History:  Diagnosis Date  . Anxiety   . Dyspnea   . Fatigue   . Hypothyroidism   . Hypothyroidism   . Menopausal state   . Palpitations         Current Outpatient Medications  Medication Sig Dispense Refill  . Cholecalciferol (VITAMIN D3 ADULT GUMMIES) 1000 units CHEW Chew 1,000 Units by mouth daily.    Marland Kitchen levothyroxine (SYNTHROID, LEVOTHROID) 75 MCG tablet Take 75 mcg by mouth daily.    . naproxen sodium (ALEVE) 220 MG tablet Take 220 mg by mouth.     No current facility-administered medications for this visit.    Allergies:   Patient has no known allergies.    Social History:  The patient  reports that she has never smoked. She has never used smokeless tobacco. She reports that she does not drink alcohol and does not use drugs.   Family History:  The patient's family history includes Hyperthyroidism in her sister; Prostate  cancer in her brother; Throat cancer in her father.    ROS:  Please see the history of present illness.   Otherwise, review of systems are positive for DOE.   All other systems are reviewed and negative.    PHYSICAL EXAM: VS:  BP 112/80   Pulse 86   Ht 5\' 6"  (1.676 m)   Wt 177 lb 9.6 oz (80.6 kg)   SpO2 97%   BMI 28.67 kg/m  , BMI Body mass index is 28.67 kg/m. GEN: Well nourished, well developed, in no acute distress  HEENT: normal  Neck: no JVD, carotid bruits, or masses Cardiac: RRR; no murmurs, rubs, or gallops,no edema  Respiratory:  clear to auscultation bilaterally, normal work of breathing GI: soft, nontender, nondistended, + BS MS: no deformity or atrophy  Skin: warm and dry, no rash Neuro:  Strength and sensation are intact Psych: euthymic mood, full affect   EKG:   The ekg ordered today demonstrates normal sinus rhythm, no ST segment changes   Recent Labs: No results found for requested labs within last 8760 hours.   Lipid Panel No results found for: CHOL, TRIG, HDL, CHOLHDL, VLDL, LDLCALC, LDLDIRECT   Other studies Reviewed: Additional studies/ records that were reviewed today with results demonstrating: labs  reviewed. Cr 0.8 in 2021. Normal TSH as well.    ASSESSMENT AND PLAN:  1. DOE: ETT to eval exercise capacity.  Echo to eval for structural heart disease.  2. Palpitations: Sounds like premature beats.  Isolated.  No syncope. No lightheadedness.  3. Ultimately, she would benefit from increasing exercise to improve her stamina.  I think some of her symptoms may be coming from deconditioning.  We can give her an exercise prescription based on the results of her treadmill test.  We also discussed calcium scoring CT scan.  We will hold off any further imaging based on the results of her treadmill test. 4. Hypothyroidism well controlled.    Current medicines are reviewed at length with the patient today.  The patient concerns regarding her medicines were  addressed.  The following changes have been made:  No change  Labs/ tests ordered today include:  No orders of the defined types were placed in this encounter.   Recommend 150 minutes/week of aerobic exercise Low fat, low carb, high fiber diet recommended  Disposition:   FU based on results.    Signed, Lance Muss, MD  12/10/2019 2:34 PM    [facility] [address] [place], [place], [place]  [address] Phone: [phone]; Fax: [phone]

## 2019-12-28 ENCOUNTER — Ambulatory Visit (INDEPENDENT_AMBULATORY_CARE_PROVIDER_SITE_OTHER): Payer: PRIVATE HEALTH INSURANCE

## 2019-12-28 ENCOUNTER — Other Ambulatory Visit: Payer: Self-pay

## 2019-12-28 ENCOUNTER — Ambulatory Visit (HOSPITAL_COMMUNITY): Payer: PRIVATE HEALTH INSURANCE | Attending: Cardiology

## 2019-12-28 DIAGNOSIS — R0600 Dyspnea, unspecified: Secondary | ICD-10-CM | POA: Insufficient documentation

## 2019-12-28 DIAGNOSIS — R0609 Other forms of dyspnea: Secondary | ICD-10-CM

## 2019-12-28 LAB — EXERCISE TOLERANCE TEST
Estimated workload: 10.1 METS
Exercise duration (min): 7 min
Exercise duration (sec): 0 s
MPHR: 167 {beats}/min
Peak HR: 162 {beats}/min
Percent HR: 97 %
RPE: 17
Rest HR: 88 {beats}/min

## 2019-12-28 LAB — ECHOCARDIOGRAM COMPLETE
Area-P 1/2: 4.49 cm2
S' Lateral: 2.6 cm

## 2020-07-21 ENCOUNTER — Other Ambulatory Visit: Payer: Self-pay | Admitting: Endocrinology

## 2020-07-21 DIAGNOSIS — E049 Nontoxic goiter, unspecified: Secondary | ICD-10-CM

## 2020-07-31 ENCOUNTER — Ambulatory Visit
Admission: RE | Admit: 2020-07-31 | Discharge: 2020-07-31 | Disposition: A | Discharge status: Home / Self Care | Payer: PRIVATE HEALTH INSURANCE | Source: Ambulatory Visit | Attending: Endocrinology | Admitting: Endocrinology

## 2020-07-31 DIAGNOSIS — E049 Nontoxic goiter, unspecified: Secondary | ICD-10-CM

## 2022-02-23 IMAGING — US US THYROID
1 series · 13 of 25 positions shown · non-contrast
Comparison: 03/05/2018 as well as multiple prior thyroid ultrasound
studies dating back to 2773.

CLINICAL DATA: Goiter.

EXAM:
THYROID ULTRASOUND
TECHNIQUE: Ultrasound examination of the thyroid gland and adjacent soft
tissues was performed.

[Series 1: us thyroid · 0.05mm/px · 13 of 47 slices shown]
[im 1/47]
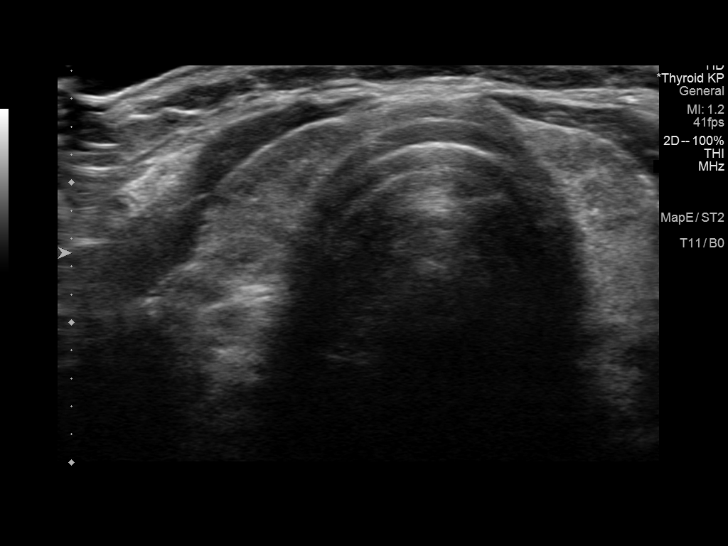
[im 4/47]
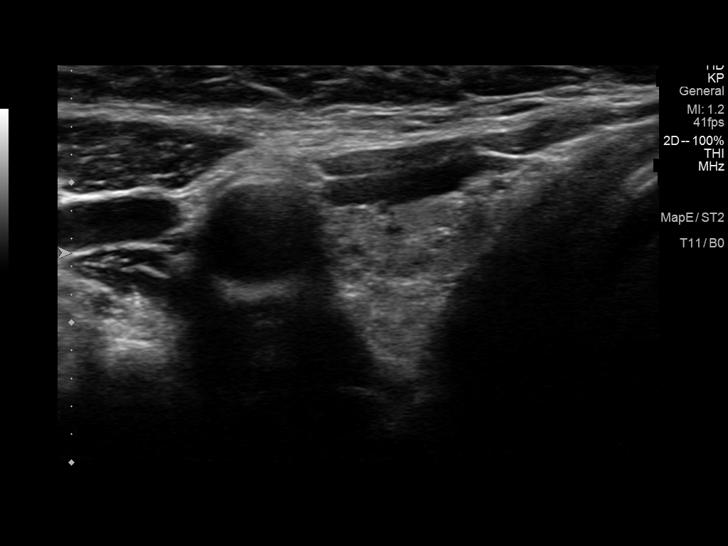
[im 8/47]
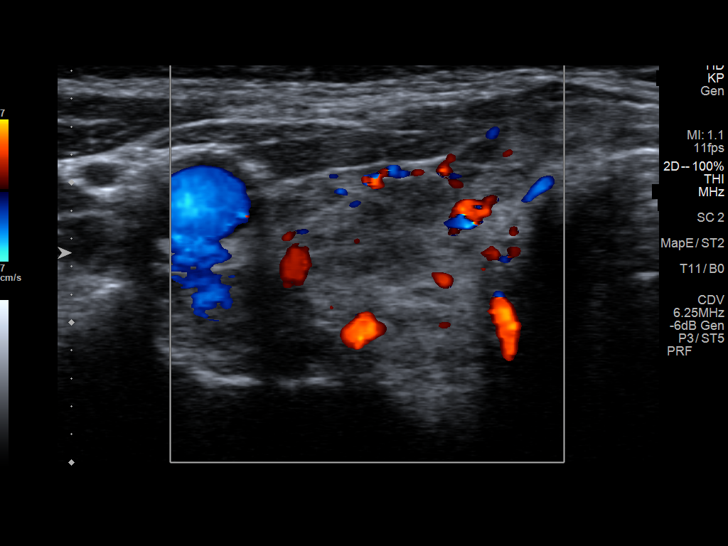
[im 12/47]
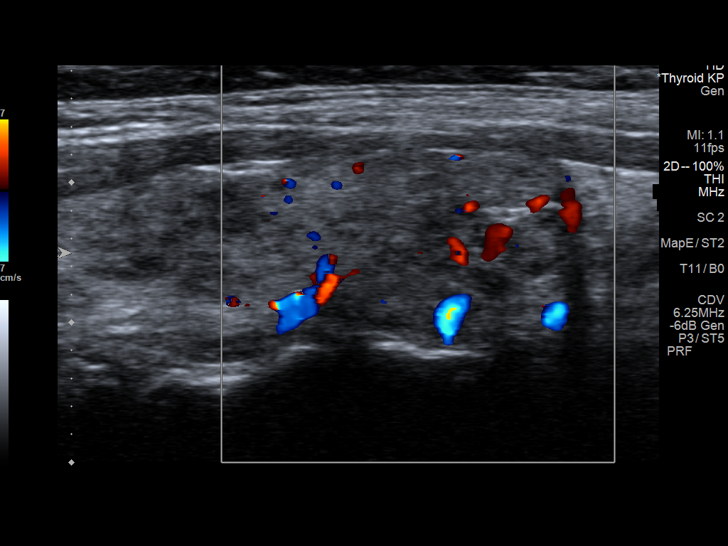
[im 16/47]
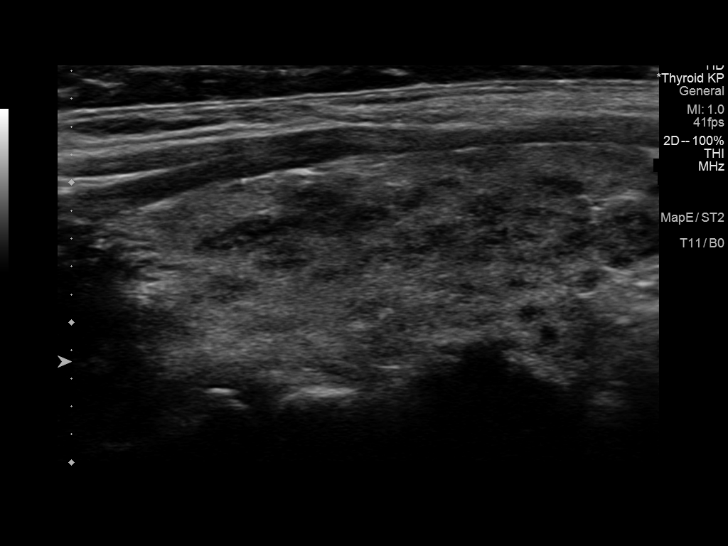
[im 20/47]
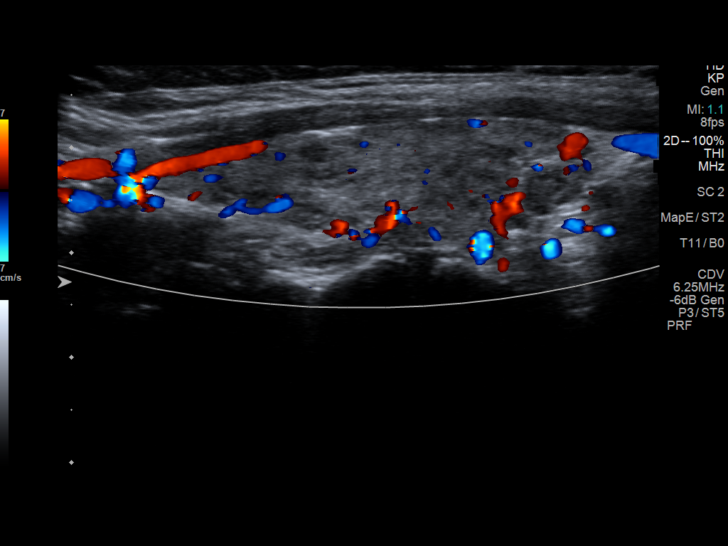
[im 24/47]
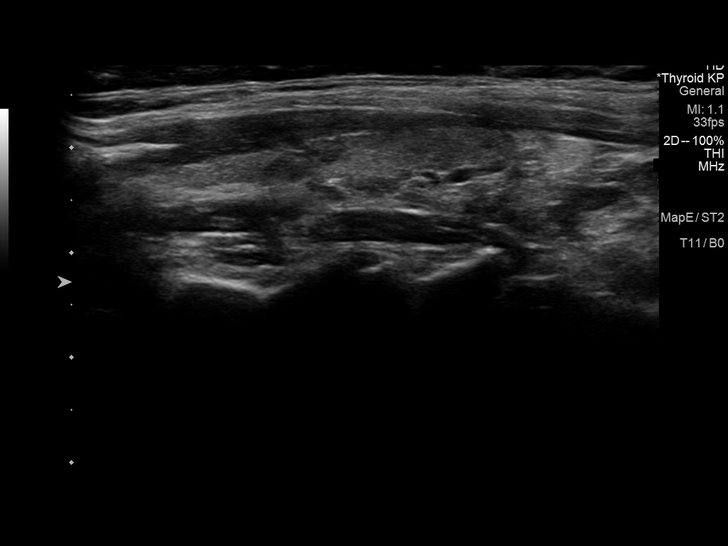
[im 27/47]
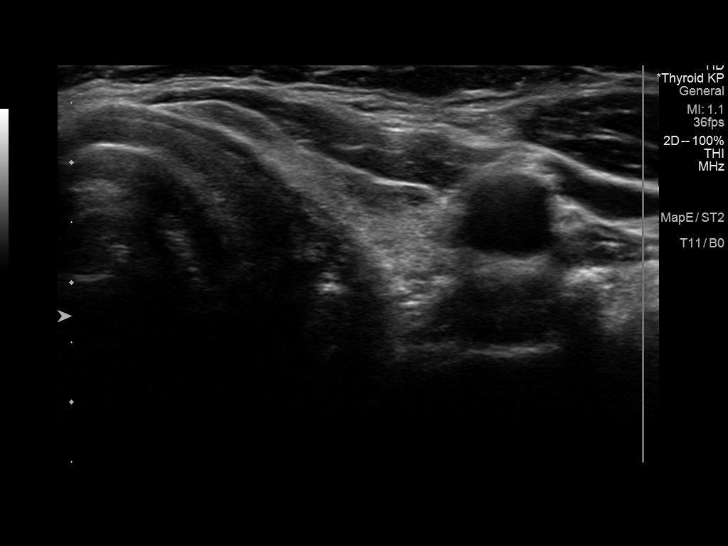
[im 31/47]
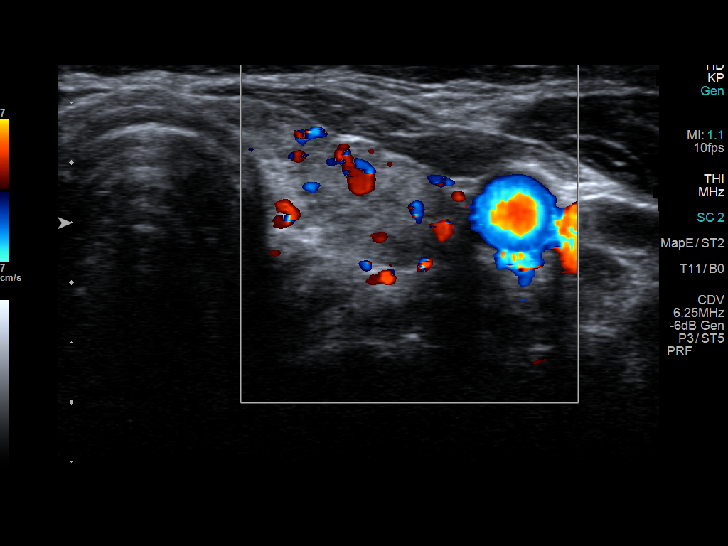
[im 35/47]
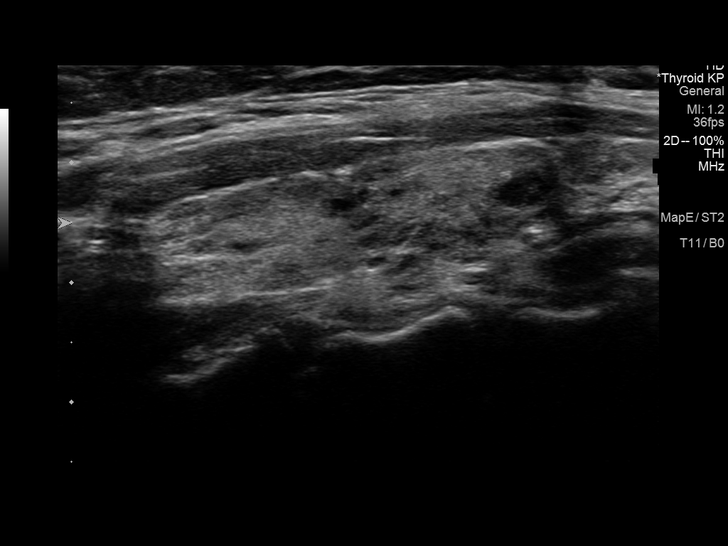
[im 39/47]
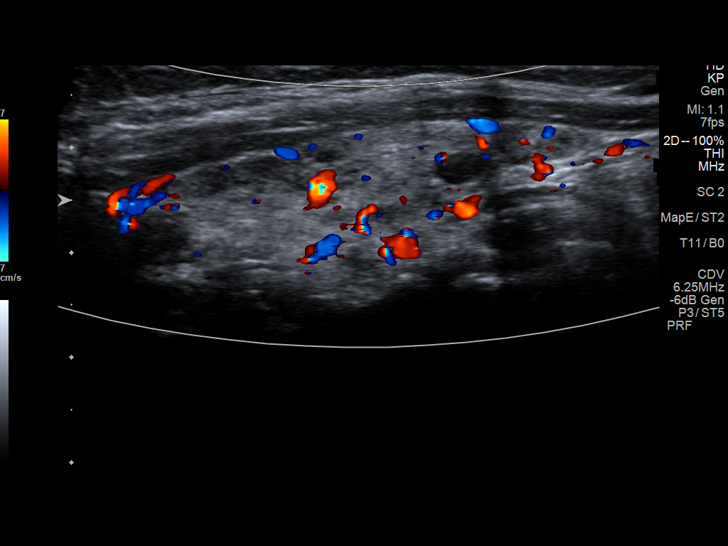
[im 43/47]
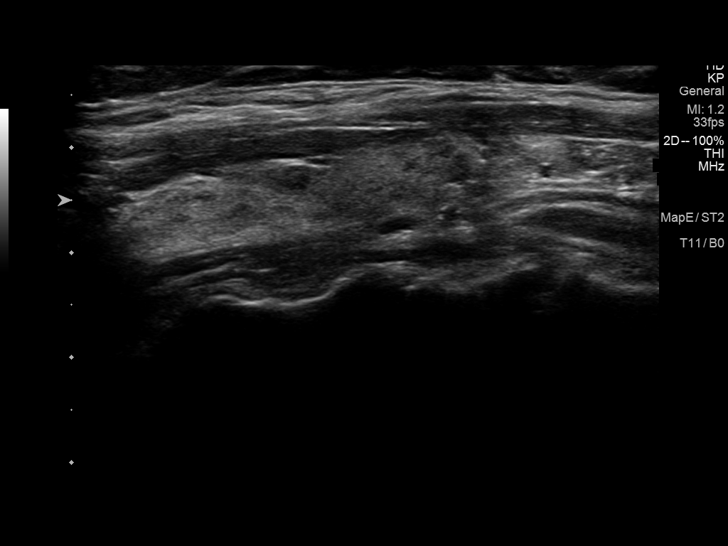
[im 47/47]
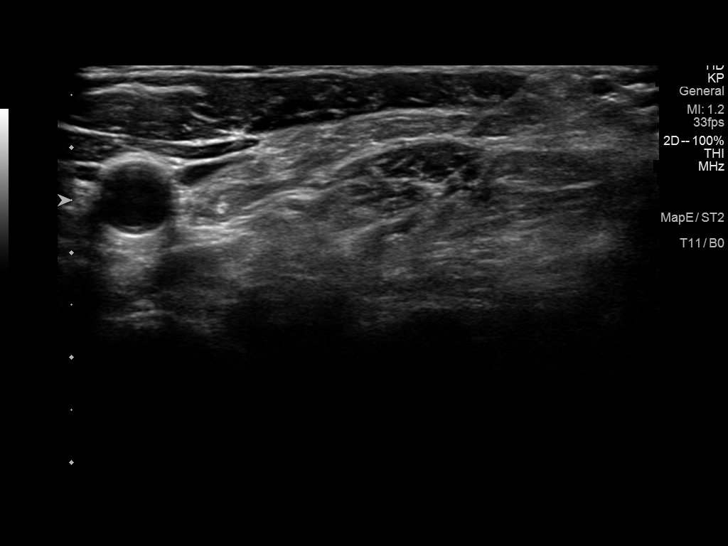

[13 of 25 positions shown; findings below may reference images not displayed]

FINDINGS: Parenchymal Echotexture: Moderately heterogenous

Isthmus: 0.2 cm

Right lobe: 4.9 x 1.4 x 1.9 cm

Left lobe: 4.7 x 1.4 x 1.6 cm

_________________________________________________________

Estimated total number of nodules >/= 1 cm: 0

Number of spongiform nodules >/=  2 cm not described below (TR1): 0

Number of mixed cystic and solid nodules >/= 1.5 cm not described
below (TR2): 0

_________________________________________________________

Nodule # 1:

Prior biopsy: No

Location: Right; Inferior

Maximum size: 0.7 cm; Other 2 dimensions: 0.6 x 0.5 cm, previously,
0.7 cm

Composition: solid/almost completely solid (2)

Echogenicity: isoechoic (1)

Shape: not taller-than-wide (0)

Margins: ill-defined (0)

Echogenic foci: none (0)

ACR TI-RADS total points: 3.

ACR TI-RADS risk category:  TR3 (3 points).

Significant change in size (>/= 20% in two dimensions and minimal
increase of 2 mm): No

Change in features: No

Change in ACR TI-RADS risk category: No

ACR TI-RADS recommendations:

Given size (<1.4 cm) and appearance, this nodule does NOT meet
TI-RADS criteria for biopsy or dedicated follow-up. This area
nodularity/pseudo nodularity appears stable dating back to 2773 and
is therefore benign.

_________________________________________________________

No abnormal lymph nodes identified.
IMPRESSION: Stable thyroid goiter with stable small area of nodularity/pseudo
nodularity along the inferior aspect of the right lobe which has
been visualized dating back to 2773.

The above is in keeping with the ACR TI-RADS recommendations - [HOSPITAL] 9420;[DATE].

## 2022-06-10 ENCOUNTER — Other Ambulatory Visit: Payer: Self-pay | Admitting: Endocrinology

## 2022-06-10 DIAGNOSIS — E049 Nontoxic goiter, unspecified: Secondary | ICD-10-CM

## 2022-08-29 ENCOUNTER — Ambulatory Visit
Admission: RE | Admit: 2022-08-29 | Discharge: 2022-08-29 | Disposition: A | Discharge status: Home / Self Care | Payer: PRIVATE HEALTH INSURANCE | Source: Ambulatory Visit | Attending: Endocrinology | Admitting: Endocrinology

## 2022-08-29 DIAGNOSIS — E049 Nontoxic goiter, unspecified: Secondary | ICD-10-CM

## 2023-12-23 ENCOUNTER — Other Ambulatory Visit (HOSPITAL_BASED_OUTPATIENT_CLINIC_OR_DEPARTMENT_OTHER): Payer: Self-pay | Admitting: Internal Medicine

## 2023-12-23 DIAGNOSIS — E039 Hypothyroidism, unspecified: Secondary | ICD-10-CM

## 2024-01-06 ENCOUNTER — Ambulatory Visit (HOSPITAL_BASED_OUTPATIENT_CLINIC_OR_DEPARTMENT_OTHER)
Admission: RE | Admit: 2024-01-06 | Discharge: 2024-01-06 | Disposition: A | Discharge status: Home / Self Care | Payer: Self-pay | Source: Ambulatory Visit | Attending: Internal Medicine | Admitting: Internal Medicine

## 2024-01-06 DIAGNOSIS — E039 Hypothyroidism, unspecified: Secondary | ICD-10-CM | POA: Insufficient documentation

## 2024-06-01 ENCOUNTER — Other Ambulatory Visit: Payer: Self-pay | Admitting: Endocrinology

## 2024-06-01 DIAGNOSIS — E049 Nontoxic goiter, unspecified: Secondary | ICD-10-CM

## 2024-06-08 ENCOUNTER — Ambulatory Visit
Admission: RE | Admit: 2024-06-08 | Discharge: 2024-06-08 | Disposition: A | Payer: PRIVATE HEALTH INSURANCE | Source: Ambulatory Visit | Attending: Endocrinology | Admitting: Endocrinology

## 2024-06-08 DIAGNOSIS — E049 Nontoxic goiter, unspecified: Secondary | ICD-10-CM
